# Patient Record
Sex: Female | Born: 1960 | Race: White | Hispanic: No | Marital: Married | Smoking: Former smoker
Health system: Southern US, Community
[De-identification: ages and names within clinical notes are randomized; demographics above are authoritative.]

## PROBLEM LIST (undated history)

## (undated) DIAGNOSIS — C801 Malignant (primary) neoplasm, unspecified: Secondary | ICD-10-CM

## (undated) DIAGNOSIS — K219 Gastro-esophageal reflux disease without esophagitis: Secondary | ICD-10-CM

## (undated) DIAGNOSIS — T7840XA Allergy, unspecified, initial encounter: Secondary | ICD-10-CM

## (undated) DIAGNOSIS — M1990 Unspecified osteoarthritis, unspecified site: Secondary | ICD-10-CM

## (undated) HISTORY — DX: Malignant (primary) neoplasm, unspecified: C80.1

## (undated) HISTORY — DX: Allergy, unspecified, initial encounter: T78.40XA

## (undated) HISTORY — PX: PANCREATICODUODENECTOMY: SUR1000

## (undated) HISTORY — DX: Gastro-esophageal reflux disease without esophagitis: K21.9

## (undated) HISTORY — DX: Unspecified osteoarthritis, unspecified site: M19.90

## (undated) HISTORY — PX: OTHER SURGICAL HISTORY: SHX169

---

## 2001-03-11 ENCOUNTER — Other Ambulatory Visit: Admission: RE | Admit: 2001-03-11 | Discharge: 2001-03-11 | Payer: Self-pay | Admitting: Obstetrics and Gynecology

## 2002-05-20 ENCOUNTER — Other Ambulatory Visit: Admission: RE | Admit: 2002-05-20 | Discharge: 2002-05-20 | Payer: Self-pay | Admitting: Family Medicine

## 2003-06-24 ENCOUNTER — Other Ambulatory Visit: Admission: RE | Admit: 2003-06-24 | Discharge: 2003-06-24 | Payer: Self-pay | Admitting: Family Medicine

## 2003-10-25 ENCOUNTER — Ambulatory Visit (HOSPITAL_COMMUNITY): Admission: RE | Admit: 2003-10-25 | Discharge: 2003-10-25 | Payer: Self-pay | Admitting: Family Medicine

## 2004-06-30 ENCOUNTER — Other Ambulatory Visit: Admission: RE | Admit: 2004-06-30 | Discharge: 2004-06-30 | Payer: Self-pay | Admitting: Family Medicine

## 2004-07-10 ENCOUNTER — Encounter: Admission: RE | Admit: 2004-07-10 | Discharge: 2004-07-10 | Payer: Self-pay | Admitting: Family Medicine

## 2005-07-16 ENCOUNTER — Other Ambulatory Visit: Admission: RE | Admit: 2005-07-16 | Discharge: 2005-07-16 | Payer: Self-pay | Admitting: Family Medicine

## 2005-07-20 ENCOUNTER — Encounter: Admission: RE | Admit: 2005-07-20 | Discharge: 2005-07-20 | Payer: Self-pay | Admitting: Family Medicine

## 2005-10-02 ENCOUNTER — Ambulatory Visit (HOSPITAL_COMMUNITY): Admission: RE | Admit: 2005-10-02 | Discharge: 2005-10-02 | Payer: Self-pay | Admitting: Family Medicine

## 2006-09-25 ENCOUNTER — Other Ambulatory Visit: Admission: RE | Admit: 2006-09-25 | Discharge: 2006-09-25 | Payer: Self-pay | Admitting: Family Medicine

## 2006-10-11 ENCOUNTER — Encounter: Admission: RE | Admit: 2006-10-11 | Discharge: 2006-10-11 | Payer: Self-pay | Admitting: Family Medicine

## 2006-12-26 ENCOUNTER — Ambulatory Visit: Payer: Self-pay | Admitting: Oncology

## 2007-01-23 LAB — CBC WITH DIFFERENTIAL/PLATELET
BASO%: 1.3 % (ref 0.0–2.0)
Basophils Absolute: 0.1 10*3/uL (ref 0.0–0.1)
Eosinophils Absolute: 0.3 10*3/uL (ref 0.0–0.5)
MCH: 30.8 pg (ref 26.0–34.0)
MCV: 86 fL (ref 81.0–101.0)
MONO%: 4.3 % (ref 0.0–13.0)
NEUT%: 63.3 % (ref 39.6–76.8)
RDW: 12.5 % (ref 11.3–14.5)
WBC: 11 10*3/uL — ABNORMAL HIGH (ref 3.9–10.0)

## 2007-01-23 LAB — CHCC SMEAR

## 2007-01-24 LAB — COMPREHENSIVE METABOLIC PANEL
AST: 12 U/L (ref 0–37)
Albumin: 4.3 g/dL (ref 3.5–5.2)
Alkaline Phosphatase: 43 U/L (ref 39–117)
BUN: 18 mg/dL (ref 6–23)
Creatinine, Ser: 1.13 mg/dL (ref 0.40–1.20)
Glucose, Bld: 87 mg/dL (ref 70–99)
Total Bilirubin: 0.5 mg/dL (ref 0.3–1.2)

## 2007-01-24 LAB — SEDIMENTATION RATE: Sed Rate: 11 mm/hr (ref 0–22)

## 2007-06-10 ENCOUNTER — Ambulatory Visit (HOSPITAL_COMMUNITY): Admission: RE | Admit: 2007-06-10 | Discharge: 2007-06-10 | Payer: Self-pay | Admitting: Family Medicine

## 2007-06-16 ENCOUNTER — Encounter: Admission: RE | Admit: 2007-06-16 | Discharge: 2007-06-16 | Payer: Self-pay | Admitting: Family Medicine

## 2007-09-29 ENCOUNTER — Other Ambulatory Visit: Admission: RE | Admit: 2007-09-29 | Discharge: 2007-09-29 | Payer: Self-pay | Admitting: Family Medicine

## 2007-10-20 ENCOUNTER — Encounter: Admission: RE | Admit: 2007-10-20 | Discharge: 2007-10-20 | Payer: Self-pay | Admitting: Family Medicine

## 2007-11-14 ENCOUNTER — Encounter: Payer: Self-pay | Admitting: Internal Medicine

## 2007-11-17 ENCOUNTER — Encounter: Payer: Self-pay | Admitting: Internal Medicine

## 2007-11-24 ENCOUNTER — Encounter: Payer: Self-pay | Admitting: Internal Medicine

## 2007-11-25 ENCOUNTER — Encounter: Admission: RE | Admit: 2007-11-25 | Discharge: 2007-11-25 | Payer: Self-pay | Admitting: Family Medicine

## 2007-11-25 ENCOUNTER — Encounter: Payer: Self-pay | Admitting: Internal Medicine

## 2007-12-09 ENCOUNTER — Ambulatory Visit: Payer: Self-pay | Admitting: Internal Medicine

## 2007-12-09 DIAGNOSIS — R0609 Other forms of dyspnea: Secondary | ICD-10-CM | POA: Insufficient documentation

## 2007-12-09 DIAGNOSIS — R0989 Other specified symptoms and signs involving the circulatory and respiratory systems: Secondary | ICD-10-CM | POA: Insufficient documentation

## 2007-12-09 DIAGNOSIS — Z85828 Personal history of other malignant neoplasm of skin: Secondary | ICD-10-CM | POA: Insufficient documentation

## 2007-12-09 DIAGNOSIS — E039 Hypothyroidism, unspecified: Secondary | ICD-10-CM | POA: Insufficient documentation

## 2007-12-09 DIAGNOSIS — K219 Gastro-esophageal reflux disease without esophagitis: Secondary | ICD-10-CM | POA: Insufficient documentation

## 2007-12-09 DIAGNOSIS — M1990 Unspecified osteoarthritis, unspecified site: Secondary | ICD-10-CM | POA: Insufficient documentation

## 2007-12-12 LAB — CONVERTED CEMR LAB: H Pylori IgG: NEGATIVE

## 2007-12-19 ENCOUNTER — Telehealth: Payer: Self-pay | Admitting: Internal Medicine

## 2007-12-23 ENCOUNTER — Ambulatory Visit: Payer: Self-pay | Admitting: Internal Medicine

## 2007-12-23 DIAGNOSIS — J4540 Moderate persistent asthma, uncomplicated: Secondary | ICD-10-CM | POA: Insufficient documentation

## 2007-12-23 DIAGNOSIS — J309 Allergic rhinitis, unspecified: Secondary | ICD-10-CM | POA: Insufficient documentation

## 2008-01-12 ENCOUNTER — Telehealth: Payer: Self-pay | Admitting: Internal Medicine

## 2008-01-20 ENCOUNTER — Telehealth (INDEPENDENT_AMBULATORY_CARE_PROVIDER_SITE_OTHER): Payer: Self-pay | Admitting: *Deleted

## 2008-02-04 ENCOUNTER — Ambulatory Visit: Payer: Self-pay | Admitting: Critical Care Medicine

## 2008-02-04 ENCOUNTER — Ambulatory Visit: Payer: Self-pay | Admitting: Internal Medicine

## 2008-02-05 ENCOUNTER — Telehealth: Payer: Self-pay | Admitting: Critical Care Medicine

## 2008-02-05 ENCOUNTER — Encounter: Payer: Self-pay | Admitting: Critical Care Medicine

## 2008-02-13 ENCOUNTER — Ambulatory Visit: Payer: Self-pay | Admitting: Internal Medicine

## 2008-02-13 LAB — CONVERTED CEMR LAB: IgE (Immunoglobulin E), Serum: 32.6 intl units/mL (ref 0.0–180.0)

## 2008-02-16 ENCOUNTER — Ambulatory Visit: Payer: Self-pay | Admitting: Critical Care Medicine

## 2008-02-17 ENCOUNTER — Encounter: Payer: Self-pay | Admitting: Critical Care Medicine

## 2008-02-24 ENCOUNTER — Telehealth: Payer: Self-pay | Admitting: Internal Medicine

## 2008-03-15 ENCOUNTER — Ambulatory Visit: Payer: Self-pay | Admitting: Critical Care Medicine

## 2008-04-09 ENCOUNTER — Ambulatory Visit: Payer: Self-pay | Admitting: Internal Medicine

## 2008-04-09 LAB — CONVERTED CEMR LAB: T3, Free: 3.1 pg/mL (ref 2.3–4.2)

## 2008-06-09 ENCOUNTER — Ambulatory Visit: Payer: Self-pay | Admitting: Critical Care Medicine

## 2008-06-09 DIAGNOSIS — J209 Acute bronchitis, unspecified: Secondary | ICD-10-CM | POA: Insufficient documentation

## 2008-06-10 ENCOUNTER — Encounter: Payer: Self-pay | Admitting: Internal Medicine

## 2008-06-10 ENCOUNTER — Telehealth: Payer: Self-pay | Admitting: Internal Medicine

## 2008-07-02 ENCOUNTER — Ambulatory Visit: Payer: Self-pay | Admitting: Critical Care Medicine

## 2008-09-15 ENCOUNTER — Ambulatory Visit: Payer: Self-pay | Admitting: Critical Care Medicine

## 2008-09-30 ENCOUNTER — Other Ambulatory Visit: Admission: RE | Admit: 2008-09-30 | Discharge: 2008-09-30 | Payer: Self-pay | Admitting: Family Medicine

## 2008-10-13 ENCOUNTER — Encounter: Admission: RE | Admit: 2008-10-13 | Discharge: 2008-10-13 | Payer: Self-pay | Admitting: Family Medicine

## 2008-11-15 ENCOUNTER — Ambulatory Visit: Payer: Self-pay | Admitting: Critical Care Medicine

## 2008-11-16 ENCOUNTER — Telehealth (INDEPENDENT_AMBULATORY_CARE_PROVIDER_SITE_OTHER): Payer: Self-pay | Admitting: *Deleted

## 2009-04-01 ENCOUNTER — Ambulatory Visit: Payer: Self-pay | Admitting: Critical Care Medicine

## 2009-05-06 ENCOUNTER — Ambulatory Visit: Payer: Self-pay | Admitting: Critical Care Medicine

## 2009-07-10 IMAGING — CT CT ANGIO CHEST
2 of 5 series · 19 of 36 positions shown · IV contrast ([ID] OMNI 300)
Comparison: Chest radiographs, [DATE].

CLINICAL DATA: Chest pain.  Short of breath.  Cough.   Assess for pulmonary emboli. 
 CHEST CT ANGIO WITH CONTRAST (PE STUDIES):
TECHNIQUE: Multidetector CT imaging of the chest was performed during bolus injection of intravenous contrast.  Multiplanar CT angiographic image reconstructions were generated to evaluate the vascular anatomy.
 Contrast: 125 cc Omnipaque 300

[Series 6: thin recons · axial · 0.70mm/px · z∈[-217,+26]mm · 16 of 221 slices shown]
[im 13/221  lung]
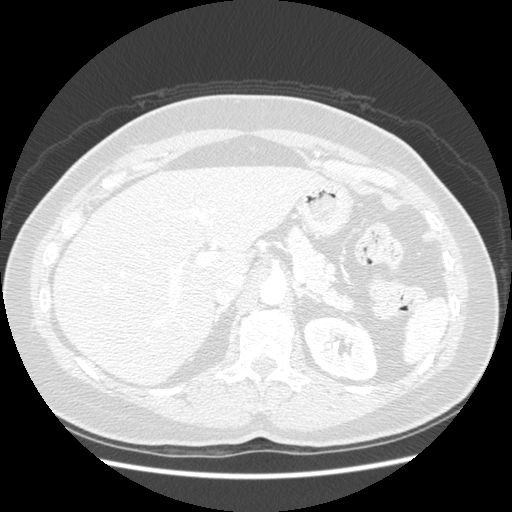
[im 26/221  mediastinal]
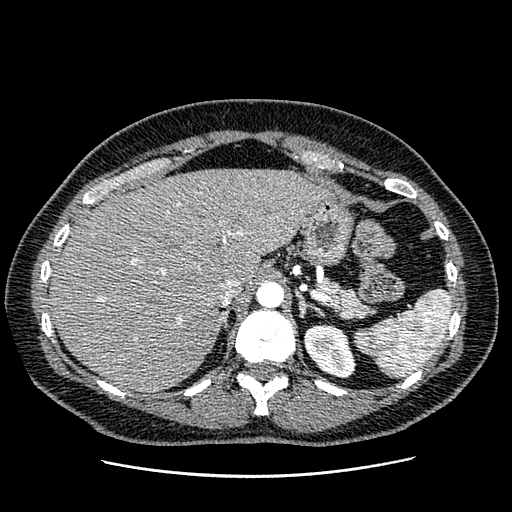
[im 39/221  lung]
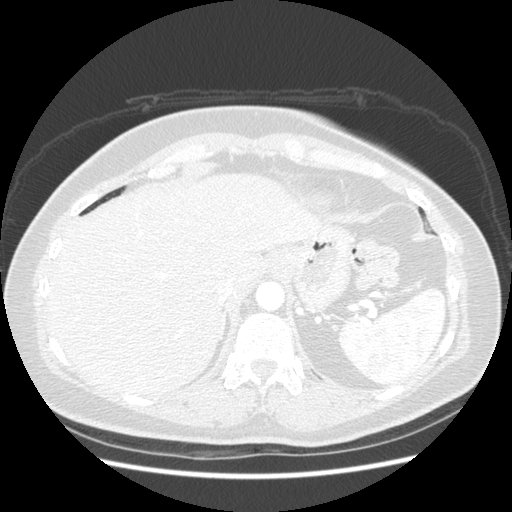
[im 52/221  mediastinal]
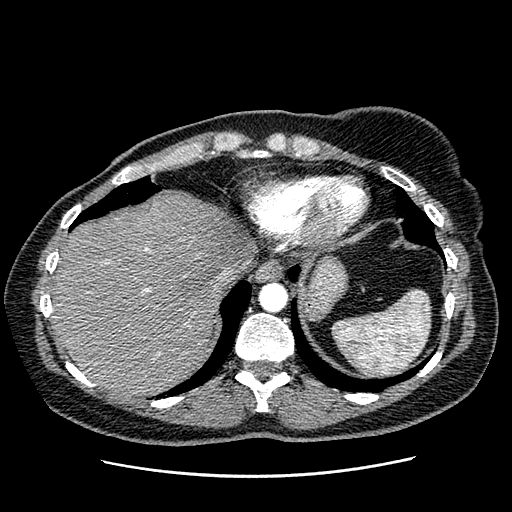
[im 65/221  lung]
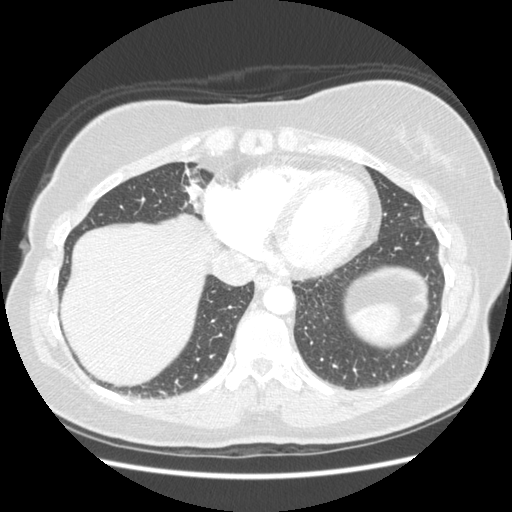
[im 78/221  mediastinal]
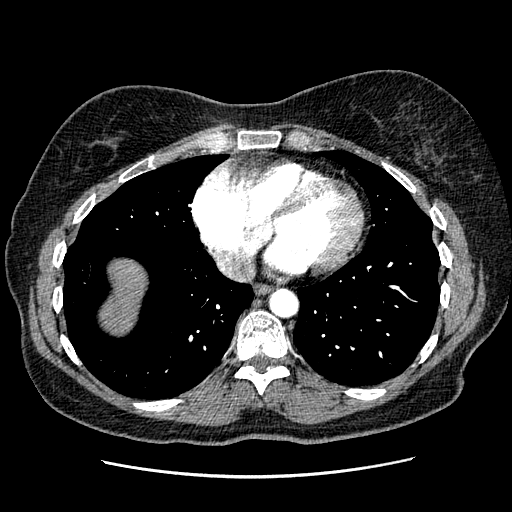
[im 91/221  lung]
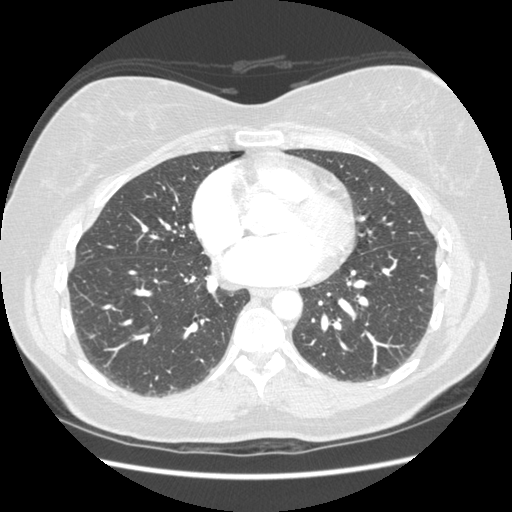
[im 104/221  mediastinal]
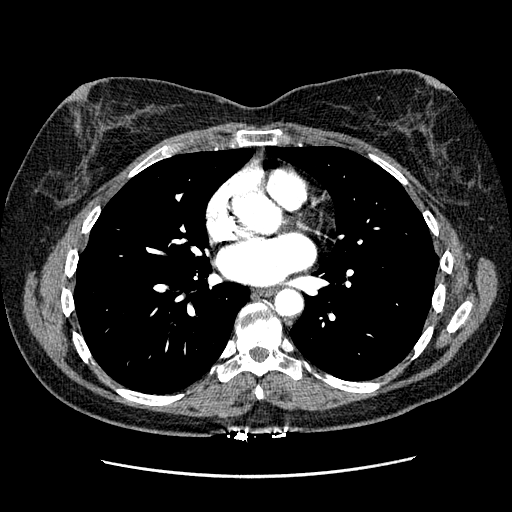
[im 117/221  lung]
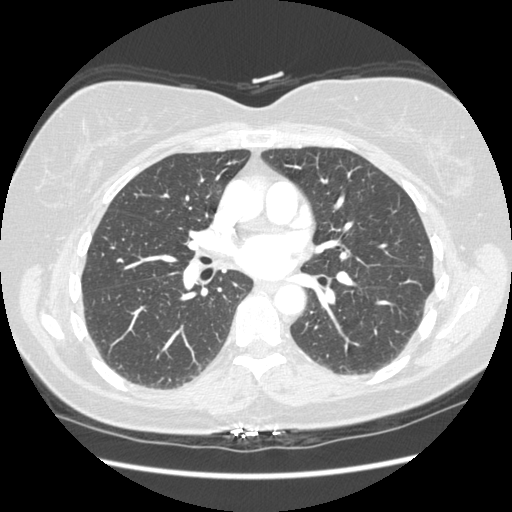
[im 130/221  mediastinal]
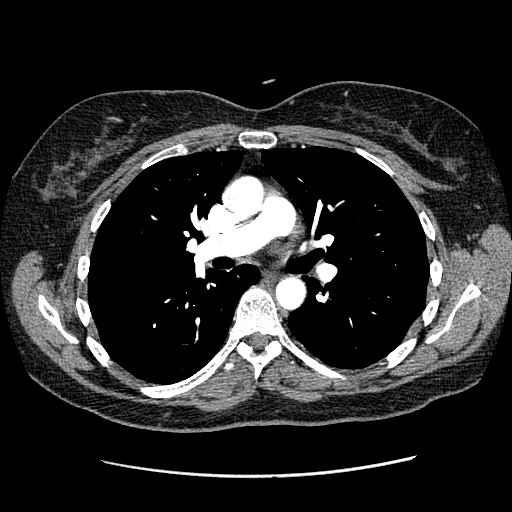
[im 143/221  lung]
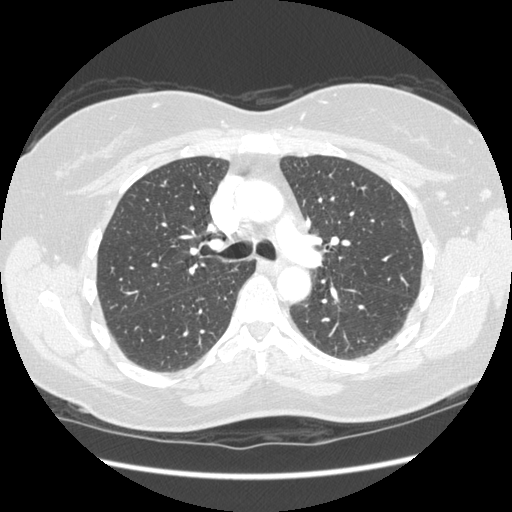
[im 156/221  mediastinal]
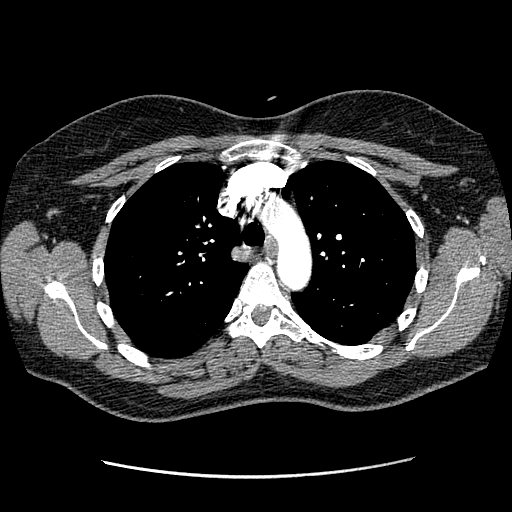
[im 169/221  lung]
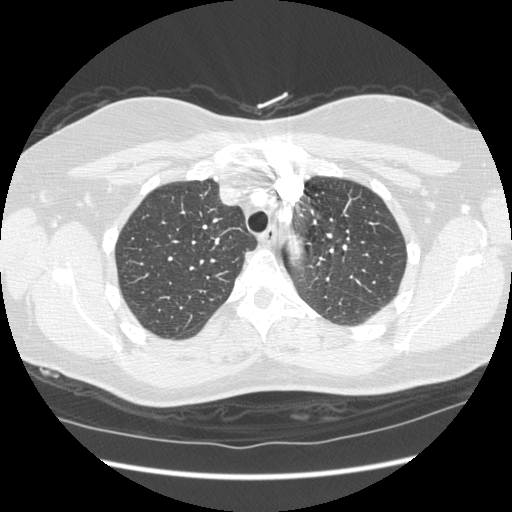
[im 182/221  mediastinal]
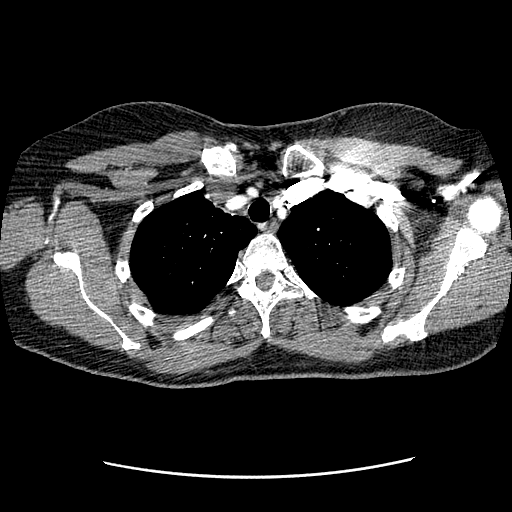
[im 195/221  lung]
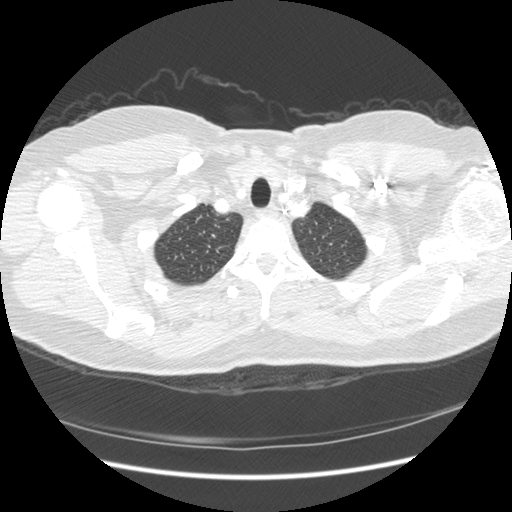
[im 208/221  mediastinal]
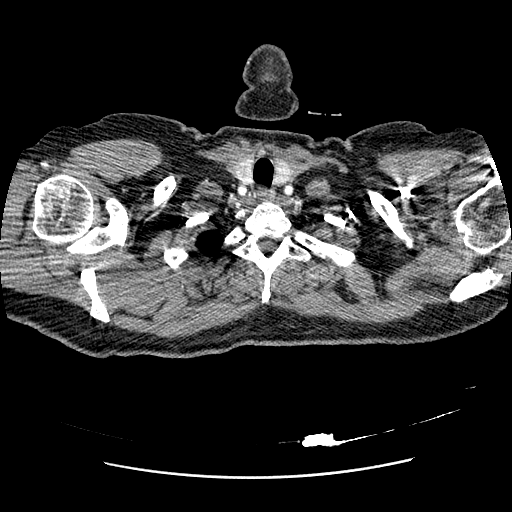

[Series 601: coronal · coronal · 0.70mm/px · 3 of 107 slices shown]
[im 22/107  mediastinal]
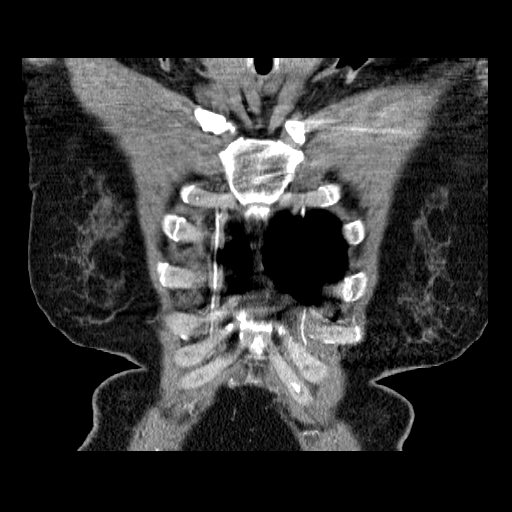
[im 43/107  mediastinal]
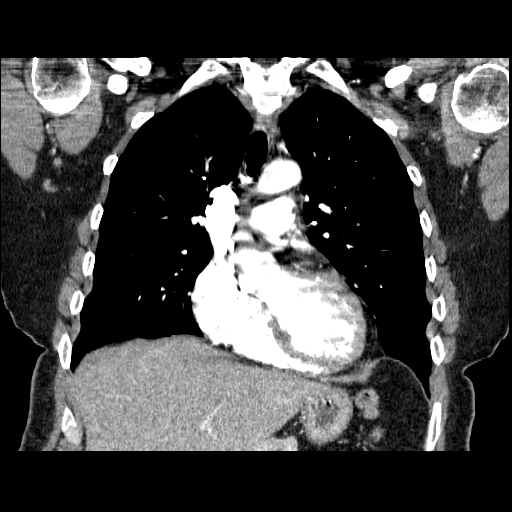
[im 64/107  mediastinal]
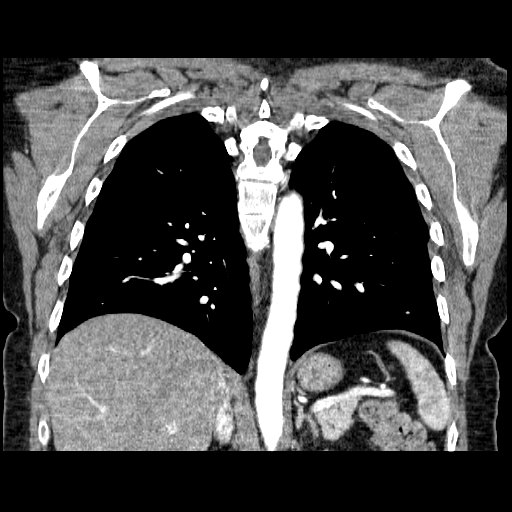

[19 of 36 positions shown; findings below may reference images not displayed]

FINDINGS: No pleural or pericardial fluid.  No mediastinal or hilar mass or adenopathy.  Pulmonary artery branches are well-opacified.  No pulmonary emboli are present.  The aorta is unremarkable.  Scans in the upper abdomen show a few renal calculi on the left, not apparently obstructive.  The spine appears unremarkable.
IMPRESSION: 1.  Negative for pulmonary emboli or any significant or active chest disease.   No cause of chest pain or cough is identified.
 2.  Left renal calculi suspected.

## 2009-09-02 ENCOUNTER — Ambulatory Visit: Payer: Self-pay | Admitting: Critical Care Medicine

## 2009-09-02 DIAGNOSIS — J018 Other acute sinusitis: Secondary | ICD-10-CM | POA: Insufficient documentation

## 2009-09-19 ENCOUNTER — Ambulatory Visit: Payer: Self-pay | Admitting: Critical Care Medicine

## 2009-10-17 ENCOUNTER — Other Ambulatory Visit: Admission: RE | Admit: 2009-10-17 | Discharge: 2009-10-17 | Payer: Self-pay | Admitting: Family Medicine

## 2010-01-05 ENCOUNTER — Encounter: Admission: RE | Admit: 2010-01-05 | Discharge: 2010-01-05 | Payer: Self-pay | Admitting: Family Medicine

## 2010-06-12 ENCOUNTER — Telehealth: Payer: Self-pay | Admitting: Critical Care Medicine

## 2010-06-16 ENCOUNTER — Ambulatory Visit: Payer: Self-pay | Admitting: Critical Care Medicine

## 2010-10-19 ENCOUNTER — Other Ambulatory Visit
Admission: RE | Admit: 2010-10-19 | Discharge: 2010-10-19 | Payer: Self-pay | Source: Home / Self Care | Admitting: Family Medicine

## 2010-10-19 ENCOUNTER — Other Ambulatory Visit: Payer: Self-pay | Admitting: Family Medicine

## 2010-10-23 ENCOUNTER — Encounter: Payer: Self-pay | Admitting: Family Medicine

## 2010-10-31 NOTE — Progress Notes (Signed)
Summary: change ins Qvar is too expensive  Phone Note Call from Patient Call back at Home Phone [phone]   Caller: Patient Call For: wright Reason for Call: Talk to Nurse Summary of Call: sample of Qvar- $100 + Initial call taken by: Eugene Gavia,  June 12, 2010 10:36 AM  Follow-up for Phone Call        Called and spoke with pt who advised she has changed insurances and the new cost for Qvar is going to be $107 and is requesting samples to pick up at Friday's appointment. Currently we are out of Qvar samples and feels the Qvar helps with her breathing. Called and left message on v-mail advising pt we currently do not have any samples but will be on the look out for some to hold for her. Will forward to PW for review. Zackery Barefoot CMA  June 12, 2010 11:06 AM   Additional Follow-up for Phone Call Additional follow up Details #1::        i am ok to give samples if we have available Additional Follow-up by: Storm Frisk MD,  June 12, 2010 12:12 PM    Additional Follow-up for Phone Call Additional follow up Details #2::    No samples available at this time will send flag to Gweneth Dimitri. Zackery Barefoot CMA  June 12, 2010 12:15 PM

## 2010-10-31 NOTE — Assessment & Plan Note (Signed)
Summary: Pulmonary OV   Copy to:  Ritta Slot, donna gates Primary Provider/Referring Provider:  Dr. Darryll Capers  CC:  Follow up.  Last seen 12.2010.  Pt states as long as she is taking qvar breathing is "99% good."  Occ dry "irritating" cough. .  History of Present Illness: 27  WF  with known history of chronic cough.  Chronic sinusitis/ moderate persistent asthma/chronic rhinits.     June 16, 2010 9:07 AM Has been doing well all year.  No new issues.  Using qvar 1-2 puff twice  daily.  Not using rescue inhaler.  Pt denies any significant sore throat, nasal congestion or excess secretions, fever, chills, sweats, unintended weight loss, pleurtic or exertional chest pain, orthopnea PND, or leg swelling Pt denies any increase in rescue therapy over baseline, denies waking up needing it or having any early am or nocturnal exacerbations of coughing/wheezing/or dyspnea.   Asthma History    Asthma Control Assessment:    Age range: 12+ years    Symptoms: 0-2 days/week    Nighttime Awakenings: 0-2/month    Interferes w/ normal activity: no limitations    SABA use (not for EIB): 0-2 days/week    ATAQ questionnaire: 0    FEV1: 2.86 liters (today)    FEV1 Pred: 2.61 liters (today)    Exacerbations requiring oral systemic steroids: 0-1/year    Asthma Control Assessment: Well Controlled   Preventive Screening-Counseling & Management  Alcohol-Tobacco     Smoking Status: never     Year Quit: 1987     Pack years: 1 ppd x 3-4 years     Passive Smoke Exposure: yes  Current Medications (verified): 1)  Synthroid 50 Mcg  Tabs (Levothyroxine Sodium) .... Once Daily 2)  Tri-Sprintec 0.035 Mg  Tabs (Norgestimate-Ethinyl Estradiol) .... As Directed 3)  Nexium 40 Mg  Cpdr (Esomeprazole Magnesium) .Marland Kitchen.. 1 By Mouth Once Daily 4)  Saline Nasal Spray 0.65 % Soln (Saline) .... One To Two Times Daily 5)  Lloyd Huger Med Nasal Wash .... As Needed 6)  Qvar 40 Mcg/act Aers (Beclomethasone Dipropionate) ....  Inhale 2 Puffs One To Two Times Daily  Allergies (verified): 1)  ! Tetanus-Diphtheria Toxoids Td (Tetanus-Diphtheria Toxoids Td) 2)  ! Anacin (Aspirin) 3)  ! Augmentin  Past History:  Past medical, surgical, family and social histories (including risk factors) reviewed, and no changes noted (except as noted below).  Past Medical History: Reviewed history from 06/09/2008 and no changes required. Hypothyroidism Skin cancer, hx of Osteoarthritis GERD Allergic rhinitis Asthma  Past Surgical History: Reviewed history from 12/09/2007 and no changes required. removal of melanoma on rt knee scar after arthroscopy arthroscopic of rt knee sterno-clavicular surg after dislocation detached retina surgery  Family History: Reviewed history from 12/09/2007 and no changes required. Father: melanoma/HTN Mother: deceased/duodenem ca Siblings: 1 brother and 1 sister-- brother has heart disease and no know health problem with sister  Social History: Reviewed history from 11/15/2008 and no changes required. Married  Alcohol use-no Drug use-no Patient states former smoker.   Quit in 1986.  1-2ppd x 4 yrs. From [place], moved to [place] 2003  Review of Systems  The patient denies shortness of breath with activity, shortness of breath at rest, productive cough, non-productive cough, coughing up blood, chest pain, irregular heartbeats, acid heartburn, indigestion, loss of appetite, weight change, abdominal pain, difficulty swallowing, sore throat, tooth/dental problems, headaches, nasal congestion/difficulty breathing through nose, sneezing, itching, ear ache, anxiety, depression, hand/feet swelling, joint stiffness or pain, rash,  change in color of mucus, and fever.    Vital Signs:  Patient profile:   50 year old female Height:      64 inches Weight:      172.50 pounds BMI:     29.72 O2 Sat:      99 % on Room air Temp:     97.8 degrees F oral Pulse rate:   65 / minute BP sitting:   140  / 80  (left arm) Cuff size:   regular  Vitals Entered By: Gweneth Dimitri RN (June 16, 2010 9:00 AM)  O2 Flow:  Room air CC: Follow up.  Last seen 12.2010.  Pt states as long as she is taking qvar breathing is "99% good."  Occ dry "irritating" cough.  Comments Medications reviewed with patient Daytime contact number verified with patient. Crystal Jones RN  June 16, 2010 9:00 AM    Physical Exam  Additional Exam:  Gen: WD WN     WF     in NAD    NCAT Heent:  no jvd, no TMG, no cervical LNademopathy, orophyx clear, nares clear Cor: RRR nl s1/s2  no s3/s4  no m r h g Abd: soft NT BSA   no masses  No HSM  no rebound or guarding Ext perfused with no c v e v.d Neuro: intact, moves all 4s, CN II-XII intact, DTRs intact Chest:clear   Pre-Spirometry FEV1    Value: 2.86 L     Pred: 2.61 L     Impression & Recommendations:  Problem # 1:  ASTHMA, INTRINSIC (ICD-493.10) Assessment Improved  Moderate persistent asthma stable  plan cont  qvar twice daily as needed saba   Medications Added to Medication List This Visit: 1)  Saline Nasal Spray 0.65 % Soln (Saline) .... One to two times daily 2)  Lloyd Huger Med [employer]  .... As needed 3)  Qvar 40 Mcg/act Aers (Beclomethasone dipropionate) .... Inhale 2 puffs one to two times daily 4)  Proair Hfa 108 (90 Base) Mcg/act Aers (Albuterol sulfate) .Marland Kitchen.. 1-2 puffs every 4-6 hours as needed  Complete Medication List: 1)  Synthroid 50 Mcg Tabs (Levothyroxine sodium) .... Once daily 2)  Tri-sprintec 0.035 Mg Tabs (Norgestimate-ethinyl estradiol) .... As directed 3)  Nexium 40 Mg Cpdr (Esomeprazole magnesium) .Marland Kitchen.. 1 by mouth once daily 4)  Saline Nasal Spray 0.65 % Soln (Saline) .... One to two times daily 5)  Lloyd Huger Med [employer]  .... As needed 6)  Qvar 40 Mcg/act Aers (Beclomethasone dipropionate) .... Inhale 2 puffs one to two times daily 7)  Proair Hfa 108 (90 Base) Mcg/act Aers (Albuterol sulfate) .Marland Kitchen.. 1-2 puffs every 4-6 hours as  needed  Other Orders: Est. Patient Level II (01027)  Patient Instructions: 1)  Proair or proventil as needed  2)  Qvar two puffs twice daily 3)  Return 12 months or as needed Prescriptions: PROAIR HFA 108 (90 BASE) MCG/ACT  AERS (ALBUTEROL SULFATE) 1-2 puffs every 4-6 hours as needed  #1 x 6   Entered and Authorized by:   Storm Frisk MD   Signed by:   Storm Frisk MD on 06/16/2010   Method used:   Electronically to        [id number]* (retail)       [address]       [place], [place]  [address]       Ph: [phone]       Fax: [phone]  RxID:   [id number] QVAR 40 MCG/ACT AERS (BECLOMETHASONE DIPROPIONATE) Inhale 2 puffs one to two times daily  #1 x 6   Entered and Authorized by:   Storm Frisk MD   Signed by:   Storm Frisk MD on 06/16/2010   Method used:   Print then Give to Patient   RxID:   [id number]    Immunization History:  Influenza Immunization History:    Influenza:  historical (08/01/2009)

## 2010-12-27 ENCOUNTER — Other Ambulatory Visit: Payer: Self-pay | Admitting: Family Medicine

## 2011-01-03 ENCOUNTER — Telehealth: Payer: Self-pay | Admitting: Critical Care Medicine

## 2011-01-03 NOTE — Telephone Encounter (Signed)
Called and advised pt that we have no samples of QVAR currently. I asked pt if she needed an rx sent to pharmacy and she states she did not.

## 2011-01-09 ENCOUNTER — Other Ambulatory Visit: Payer: Self-pay | Admitting: Family Medicine

## 2011-01-09 DIAGNOSIS — N644 Mastodynia: Secondary | ICD-10-CM

## 2011-01-10 ENCOUNTER — Ambulatory Visit
Admission: RE | Admit: 2011-01-10 | Discharge: 2011-01-10 | Disposition: A | Discharge status: Home / Self Care | Payer: BLUE CROSS/BLUE SHIELD | Source: Ambulatory Visit | Attending: Family Medicine | Admitting: Family Medicine

## 2011-01-10 ENCOUNTER — Other Ambulatory Visit: Payer: Self-pay | Admitting: Family Medicine

## 2011-01-10 DIAGNOSIS — N644 Mastodynia: Secondary | ICD-10-CM

## 2011-02-16 NOTE — Group Therapy Note (Signed)
NAMEARIELLA, VOIT NO.:  [id number]   MEDICAL RECORD NO.:  [id number]                   PATIENT TYPE:  OUT   LOCATION:  [facility]                                 FACILITY:  [facility]   PHYSICIAN:  Vania Rea, M.D.              DATE OF BIRTH:  08/15/1961   DATE OF PROCEDURE:  DATE OF DISCHARGE:  10/25/2003                                   PROGRESS NOTE   No dictation for this job.      ___________________________________________                                            Vania Rea, M.D.   LC/MEDQ  D:  01/29/2004  T:  01/29/2004  Job:  [id number]

## 2011-04-05 ENCOUNTER — Encounter: Payer: Self-pay | Admitting: Internal Medicine

## 2011-04-06 ENCOUNTER — Ambulatory Visit: Payer: BLUE CROSS/BLUE SHIELD | Admitting: Internal Medicine

## 2011-04-06 ENCOUNTER — Encounter: Payer: Self-pay | Admitting: Pulmonary Disease

## 2011-04-06 ENCOUNTER — Telehealth: Payer: Self-pay | Admitting: Critical Care Medicine

## 2011-04-06 ENCOUNTER — Ambulatory Visit (INDEPENDENT_AMBULATORY_CARE_PROVIDER_SITE_OTHER): Payer: BC Managed Care – PPO | Admitting: Pulmonary Disease

## 2011-04-06 VITALS — BP 128/80 | HR 66 | Temp 98.2°F | Ht 64.0 in | Wt 171.4 lb

## 2011-04-06 DIAGNOSIS — J45901 Unspecified asthma with (acute) exacerbation: Secondary | ICD-10-CM

## 2011-04-06 MED ORDER — LEVOFLOXACIN 750 MG PO TABS: 750.0000 mg | ORAL_TABLET | Freq: Every day | ORAL | Status: AC

## 2011-04-06 MED ORDER — ALBUTEROL SULFATE HFA 108 (90 BASE) MCG/ACT IN AERS: 2.0000 | INHALATION_SPRAY | Freq: Four times a day (QID) | RESPIRATORY_TRACT | Status: DC | PRN

## 2011-04-06 MED ORDER — BENZONATATE 100 MG PO CAPS: 200.0000 mg | ORAL_CAPSULE | Freq: Four times a day (QID) | ORAL | Status: AC | PRN

## 2011-04-06 MED ORDER — BECLOMETHASONE DIPROPIONATE 40 MCG/ACT IN AERS: 2.0000 | INHALATION_SPRAY | Freq: Two times a day (BID) | RESPIRATORY_TRACT | Status: DC

## 2011-04-06 MED ORDER — PREDNISONE 10 MG PO TABS: ORAL_TABLET | ORAL | Status: DC

## 2011-04-06 NOTE — Telephone Encounter (Signed)
Spoke with pt.  She c/o SOB and nonprod cough since yesterday.  Pt last OV with PW on 06/16/10.  OV scheduled for today at 1:30pm with [facility] -- pt aware and ok with this appt.

## 2011-04-06 NOTE — Progress Notes (Signed)
  Subjective:    Patient ID: Lynn Ashley, female    DOB: 1961/08/06, 50 y.o.   MRN: [id number]  HPI The pt comes in today for an acute sick visit.  She has known asthma and rhinosinusitis, and is usually followed by Dr. Delford Field.  She gives a few days history of increased cough, congestion, sob, but is not bringing up mucus.  She has required increased use of her rescue inhaler.  She states this is "just the beginning", and usually she gets a bad "chest infection"   Review of Systems  Constitutional: Negative for fever and unexpected weight change.  HENT: Positive for congestion and sneezing. Negative for ear pain, nosebleeds, sore throat, rhinorrhea, trouble swallowing, dental problem, postnasal drip and sinus pressure.   Eyes: Negative for redness and itching.  Respiratory: Positive for cough and shortness of breath. Negative for chest tightness and wheezing.   Cardiovascular: Positive for chest pain. Negative for palpitations and leg swelling.  Gastrointestinal: Negative for nausea and vomiting.  Genitourinary: Negative for dysuria.  Musculoskeletal: Negative for joint swelling.  Skin: Negative for rash.  Neurological: Positive for headaches.  Hematological: Does not bruise/bleed easily.  Psychiatric/Behavioral: Negative for dysphoric mood. The patient is not nervous/anxious.        Objective:   Physical Exam Wd female in nad Nares with inflammed mucosa, but no purulence Op without exudates or lesions seen Chest with rhonchi throughout, good airflow Cor with rrr LE without edema, no cyanosis Alert, oriented, moves all 4        Assessment & Plan:

## 2011-04-06 NOTE — Assessment & Plan Note (Signed)
The pt is having an acute asthma exacerbation, and will require a course of prednisone.  It is unclear if she has a developing sinobronchitis, but will give her a prescription for levaquin to hold, especially since she is going out of town in a few days.

## 2011-04-06 NOTE — Patient Instructions (Signed)
Will treat with a course of prednisone to help with your asthma flareup Will give you a prescription for an antibiotic to hold.  Do not take unless you begin to cough up purulent mucus. Tessalon pearls as directed for cough.

## 2011-07-18 ENCOUNTER — Other Ambulatory Visit: Payer: Self-pay | Admitting: Pulmonary Disease

## 2011-08-14 ENCOUNTER — Ambulatory Visit: Payer: BC Managed Care – PPO | Admitting: Adult Health

## 2011-09-03 ENCOUNTER — Encounter: Payer: Self-pay | Admitting: Critical Care Medicine

## 2011-09-03 ENCOUNTER — Ambulatory Visit (INDEPENDENT_AMBULATORY_CARE_PROVIDER_SITE_OTHER): Payer: 59 | Admitting: Critical Care Medicine

## 2011-09-03 DIAGNOSIS — J45909 Unspecified asthma, uncomplicated: Secondary | ICD-10-CM

## 2011-09-03 MED ORDER — BECLOMETHASONE DIPROPIONATE 40 MCG/ACT IN AERS: 2.0000 | INHALATION_SPRAY | Freq: Two times a day (BID) | RESPIRATORY_TRACT | Status: DC

## 2011-09-03 NOTE — Progress Notes (Signed)
Subjective:    Patient ID: Lynn Ashley, female    DOB: [birth year], 50 y.o.   MRN: [id number]  HPI  35 WF with known history of chronic cough. Chronic sinusitis/ moderate persistent asthma/chronic rhinits.   04/06/11 Acute [facility] OV The pt comes in today for an acute sick visit.  She has known asthma and rhinosinusitis, and is usually followed by Dr. Delford Field.  She gives a few days history of increased cough, congestion, sob, but is not bringing up mucus.  She has required increased use of her rescue inhaler.  She states this is "just the beginning", and usually she gets a bad "chest infection"  12/3 Pt not seen since 06/2010.  Did have an acute OV 7/12..  Was rx pred,  Did not fill levaquin.  Since 7/12,  Feels ok now,   Pt feels tired.  Working 70hrs per week. Notes a scratchy throat.  Occ chokes.  No real pn drip .  Has some nasal congestion and sneezes and will itch. No real cough.  Not using rescue inhaler PUL ASTHMA HISTORY 09/03/2011  Symptoms 0-2 days/week  Nighttime awakenings 0-2/month  Interference with activity No limitations  SABA use 0-2 days/wk  Exacerbations requiring oral steroids 0-1 / year     Review of Systems  Constitutional: Negative for fever and unexpected weight change.  HENT: Positive for congestion and sneezing. Negative for ear pain, nosebleeds, sore throat, rhinorrhea, trouble swallowing, dental problem, postnasal drip and sinus pressure.   Eyes: Negative for redness and itching.  Respiratory: Positive for cough and shortness of breath. Negative for chest tightness and wheezing.   Cardiovascular: Positive for chest pain. Negative for palpitations and leg swelling.  Gastrointestinal: Negative for nausea and vomiting.  Genitourinary: Negative for dysuria.  Musculoskeletal: Negative for joint swelling.  Skin: Negative for rash.  Neurological: Positive for headaches.  Hematological: Does not bruise/bleed easily.  Psychiatric/Behavioral: Negative for  dysphoric mood. The patient is not nervous/anxious.        Objective:   Physical Exam  Filed Vitals:   09/03/11 1350  BP: 122/86  Pulse: 67  Temp: 98.8 F (37.1 C)  TempSrc: Oral  Height: 5\' 4"  (1.626 m)  Weight: 174 lb 6.4 oz (79.107 kg)  SpO2: 98%    Gen: Pleasant, well-nourished, in no distress,  normal affect  ENT: No lesions,  mouth clear,  oropharynx clear, no postnasal drip  Neck: No JVD, no TMG, no carotid bruits  Lungs: No use of accessory muscles, no dullness to percussion, clear without rales or rhonchi  Cardiovascular: RRR, heart sounds normal, no murmur or gallops, no peripheral edema  Abdomen: soft and NT, no HSM,  BS normal  Musculoskeletal: No deformities, no cyanosis or clubbing  Neuro: alert, non focal  Skin: Warm, no lesions or rashes  No results found.   Assessment & Plan:   ASTHMA, INTRINSIC Moderate persistent asthma with allergic rhinitis flare and atopy Plan Cont ICS Use antihistamine     Updated Medication List Outpatient Encounter Prescriptions as of 09/03/2011  Medication Sig Dispense Refill  . albuterol (PROAIR HFA) 108 (90 BASE) MCG/ACT inhaler Inhale 2 puffs into the lungs every 6 (six) hours as needed.  1 Inhaler  0  . beclomethasone (QVAR) 40 MCG/ACT inhaler Inhale 2 puffs into the lungs 2 (two) times daily.  1 Inhaler  6  . esomeprazole (NEXIUM) 40 MG capsule Take 40 mg by mouth daily.        Marland Kitchen levothyroxine (SYNTHROID, LEVOTHROID) 50  MCG tablet Take 50 mcg by mouth daily.        . Multiple Vitamins-Minerals (MULTIVITAMIN GUMMIES ADULT PO) Take 1 tablet by mouth daily.        Marland Kitchen NASAL WASH NA Place into the nose as needed.        Lorita Officer Triphasic (NORGESTIMATE-ETHINYL ESTRADIOL PO) Take by mouth daily.       . sodium chloride (OCEAN) 0.65 % nasal spray Place 1 spray into the nose daily.       Marland Kitchen DISCONTD: QVAR 40 MCG/ACT inhaler INHALE TWO PUFFS BY MOUTH TWICE DAILY  1 Inhaler  0  . DISCONTD: predniSONE  (DELTASONE) 10 MG tablet Take 4 tabs daily x 2 days, then 3 tabs daily x 2 days, then 2 tabs daily x 2 days, then 1 tab daily x 2 days, then stop.  20 tablet  0

## 2011-09-03 NOTE — Patient Instructions (Signed)
Can [place] chlormetron (chlorpheniramine) 4-8mg  at bedtime for sneezing, itching Stay on Qvar 2puff 1-2 times daily Return 12 months , sooner if needed

## 2011-09-04 NOTE — Assessment & Plan Note (Signed)
Moderate persistent asthma with allergic rhinitis flare and atopy Plan Cont ICS Use antihistamine

## 2011-10-24 ENCOUNTER — Ambulatory Visit
Admission: RE | Admit: 2011-10-24 | Discharge: 2011-10-24 | Disposition: A | Discharge status: Home / Self Care | Payer: 59 | Source: Ambulatory Visit | Attending: Family Medicine | Admitting: Family Medicine

## 2011-10-24 ENCOUNTER — Other Ambulatory Visit: Payer: Self-pay | Admitting: Family Medicine

## 2011-10-24 DIAGNOSIS — R1030 Lower abdominal pain, unspecified: Secondary | ICD-10-CM

## 2012-01-18 ENCOUNTER — Other Ambulatory Visit: Payer: Self-pay | Admitting: Family Medicine

## 2012-01-18 DIAGNOSIS — Z1231 Encounter for screening mammogram for malignant neoplasm of breast: Secondary | ICD-10-CM

## 2012-01-27 ENCOUNTER — Other Ambulatory Visit: Payer: Self-pay | Admitting: Pulmonary Disease

## 2012-02-01 ENCOUNTER — Other Ambulatory Visit: Payer: Self-pay | Admitting: *Deleted

## 2012-02-01 DIAGNOSIS — J45901 Unspecified asthma with (acute) exacerbation: Secondary | ICD-10-CM

## 2012-02-01 MED ORDER — ALBUTEROL SULFATE HFA 108 (90 BASE) MCG/ACT IN AERS: 2.0000 | INHALATION_SPRAY | Freq: Four times a day (QID) | RESPIRATORY_TRACT | Status: DC | PRN

## 2012-02-08 ENCOUNTER — Ambulatory Visit
Admission: RE | Admit: 2012-02-08 | Discharge: 2012-02-08 | Disposition: A | Discharge status: Home / Self Care | Payer: 59 | Source: Ambulatory Visit | Attending: Family Medicine | Admitting: Family Medicine

## 2012-02-08 DIAGNOSIS — Z1231 Encounter for screening mammogram for malignant neoplasm of breast: Secondary | ICD-10-CM

## 2012-06-27 ENCOUNTER — Other Ambulatory Visit: Payer: Self-pay | Admitting: Pulmonary Disease

## 2012-07-28 ENCOUNTER — Other Ambulatory Visit: Payer: Self-pay | Admitting: Critical Care Medicine

## 2012-09-03 ENCOUNTER — Ambulatory Visit (INDEPENDENT_AMBULATORY_CARE_PROVIDER_SITE_OTHER): Payer: 59 | Admitting: Critical Care Medicine

## 2012-09-03 ENCOUNTER — Encounter: Payer: Self-pay | Admitting: Critical Care Medicine

## 2012-09-03 VITALS — BP 138/70 | HR 67 | Temp 98.0°F | Ht 64.0 in | Wt 176.5 lb

## 2012-09-03 DIAGNOSIS — Z23 Encounter for immunization: Secondary | ICD-10-CM

## 2012-09-03 DIAGNOSIS — J45901 Unspecified asthma with (acute) exacerbation: Secondary | ICD-10-CM

## 2012-09-03 DIAGNOSIS — J45909 Unspecified asthma, uncomplicated: Secondary | ICD-10-CM

## 2012-09-03 MED ORDER — BECLOMETHASONE DIPROPIONATE 40 MCG/ACT IN AERS: 2.0000 | INHALATION_SPRAY | Freq: Two times a day (BID) | RESPIRATORY_TRACT | Status: DC

## 2012-09-03 MED ORDER — ALBUTEROL SULFATE HFA 108 (90 BASE) MCG/ACT IN AERS: 2.0000 | INHALATION_SPRAY | Freq: Four times a day (QID) | RESPIRATORY_TRACT | Status: DC | PRN

## 2012-09-03 NOTE — Progress Notes (Signed)
Subjective:    Patient ID: Lynn Ashley, female    DOB: Sep 06, 1961, 51 y.o.   MRN: [id number]  HPI 51 y.o. WF  with known history of chronic cough. Chronic sinusitis/ moderate persistent asthma/chronic rhinits.   51/12/2011 Annual f/u: Since last OV walking 1-2 miles per day.  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  Min cough.  Some pndrip and nose blocked in AM.  No SABA use, Maintains Qvar.   PUL ASTHMA HISTORY 09/03/2012 09/03/2011  Symptoms 0-2 days/week 0-2 days/week  Nighttime awakenings 0-2/month 0-2/month  Interference with activity No limitations No limitations  SABA use 0-2 days/wk 0-2 days/wk  Exacerbations requiring oral steroids 0-1 / year 0-1 / year     Review of Systems  Constitutional: Negative for unexpected weight change.  HENT: Positive for congestion. Negative for nosebleeds, dental problem and sinus pressure.   Eyes: Negative for redness and itching.  Respiratory: Negative for chest tightness.   Cardiovascular: Negative for palpitations and leg swelling.  Gastrointestinal: Negative for nausea and vomiting.  Genitourinary: Negative for dysuria.  Musculoskeletal: Negative for joint swelling.  Skin: Negative for rash.  Hematological: Does not bruise/bleed easily.  Psychiatric/Behavioral: Negative for dysphoric mood. The patient is not nervous/anxious.        Objective:   Physical Exam  Filed Vitals:   09/03/12 1353  BP: 138/70  Pulse: 67  Temp: 98 F (36.7 C)  TempSrc: Oral  Height: 5\' 4"  (1.626 m)  Weight: 176 lb 8 oz (80.06 kg)  SpO2: 97%    Gen: Pleasant, well-nourished, in no distress,  normal affect  ENT: No lesions,  mouth clear,  oropharynx clear, no postnasal drip  Neck: No JVD, no TMG, no carotid bruits  Lungs: No use of accessory muscles, no dullness to percussion, clear without rales or rhonchi  Cardiovascular: RRR, heart sounds normal, no murmur or gallops, no  peripheral edema  Abdomen: soft and NT, no HSM,  BS normal  Musculoskeletal: No deformities, no cyanosis or clubbing  Neuro: alert, non focal  Skin: Warm, no lesions or rashes  No results found.   Assessment & Plan:   ASTHMA, INTRINSIC Moderate persistent asthma stable at this time Plan Maintain inhaled medications as prescribed Refills on Qvar and proair sent Flu vaccine given today Return 1 year or as needed      Updated Medication List Outpatient Encounter Prescriptions as of 09/03/2012  Medication Sig Dispense Refill  . albuterol (PROAIR HFA) 108 (90 BASE) MCG/ACT inhaler Inhale 2 puffs into the lungs every 6 (six) hours as needed.  1 Inhaler  5  . beclomethasone (QVAR) 40 MCG/ACT inhaler Inhale 2 puffs into the lungs 2 (two) times daily.  1 Inhaler  6  . esomeprazole (NEXIUM) 40 MG capsule Take 40 mg by mouth every other day.       . levothyroxine (SYNTHROID, LEVOTHROID) 50 MCG tablet Take 50 mcg by mouth daily.        . Multiple Vitamins-Minerals (MULTIVITAMIN GUMMIES ADULT PO) Take 1 tablet by mouth daily.        Marland Kitchen NASAL WASH NA Place into the nose as needed.        Lorita Officer Triphasic (NORGESTIMATE-ETHINYL ESTRADIOL PO) Take by mouth daily.       . sodium chloride (OCEAN) 0.65 % nasal spray Place 1 spray into the nose daily.       . [DISCONTINUED] albuterol (PROAIR HFA) 108 (  90 BASE) MCG/ACT inhaler Inhale 2 puffs into the lungs every 6 (six) hours as needed.  1 Inhaler  5  . [DISCONTINUED] beclomethasone (QVAR) 40 MCG/ACT inhaler Inhale 2 puffs into the lungs 2 (two) times daily.  1 Inhaler  6  . [DISCONTINUED] QVAR 40 MCG/ACT inhaler INHALE TWO PUFFS BY MOUTH TWICE DAILY  1 Inhaler  1

## 2012-09-03 NOTE — Patient Instructions (Addendum)
Refills on Qvar and proair sent Flu vaccine given today Return 1 year or as needed

## 2012-09-04 NOTE — Assessment & Plan Note (Signed)
Moderate persistent asthma stable at this time Plan Maintain inhaled medications as prescribed Refills on Qvar and proair sent Flu vaccine given today Return 1 year or as needed

## 2012-11-04 ENCOUNTER — Telehealth: Payer: Self-pay | Admitting: Critical Care Medicine

## 2012-11-04 MED ORDER — LEVOFLOXACIN 500 MG PO TABS: 500.0000 mg | ORAL_TABLET | Freq: Every day | ORAL | Status: DC

## 2012-11-04 NOTE — Telephone Encounter (Signed)
Per SN----ok to send in levaquin 500 mg  #7  1 daily.  i have called and spoke with the pt and she is aware of medications that have been sent to her pharmacy.

## 2012-11-04 NOTE — Telephone Encounter (Signed)
I spoke with pt. She c/o sore throat, chest congestion, cough w/ green phlem, possible fever last night (felt hot), sweats last  Night, nasal congestion and PND x Saturday. No wheezing, chest tx. She took benzonatate last night and has been using her QVAR inhaler. She is requesting to have something called in. Pt asks if this could be addressed this morning. Will forward to [facility] of the day since PW is off this AM. Please advise Sn thanks [facility] Last OV 09/03/12 No pending APPT Allergies  Allergen Reactions  . Amoxicillin-Pot Clavulanate   . Aspirin-Caffeine     REACTION: childhood - nasuea/vomiting  . Tetanus-Diphtheria Toxoids Td     REACTION: whelps, itchy

## 2012-11-04 NOTE — Telephone Encounter (Signed)
Pt called back & requests this be taken care of asap.  Pt states that she is afraid this will turn into pneumonia if this is not addressed soon.   Antionette Fairy

## 2013-03-19 ENCOUNTER — Other Ambulatory Visit: Payer: Self-pay

## 2013-03-19 DIAGNOSIS — Z1231 Encounter for screening mammogram for malignant neoplasm of breast: Secondary | ICD-10-CM

## 2013-03-27 ENCOUNTER — Ambulatory Visit: Payer: 59

## 2013-04-01 ENCOUNTER — Ambulatory Visit
Admission: RE | Admit: 2013-04-01 | Discharge: 2013-04-01 | Disposition: A | Discharge status: Home / Self Care | Payer: No Typology Code available for payment source | Source: Ambulatory Visit

## 2013-04-01 DIAGNOSIS — Z1231 Encounter for screening mammogram for malignant neoplasm of breast: Secondary | ICD-10-CM

## 2013-07-13 ENCOUNTER — Ambulatory Visit (INDEPENDENT_AMBULATORY_CARE_PROVIDER_SITE_OTHER)
Admission: RE | Admit: 2013-07-13 | Discharge: 2013-07-13 | Disposition: A | Discharge status: Home / Self Care | Payer: No Typology Code available for payment source | Source: Ambulatory Visit | Attending: Adult Health | Admitting: Adult Health

## 2013-07-13 ENCOUNTER — Ambulatory Visit (INDEPENDENT_AMBULATORY_CARE_PROVIDER_SITE_OTHER): Payer: No Typology Code available for payment source | Admitting: Adult Health

## 2013-07-13 ENCOUNTER — Encounter: Payer: Self-pay | Admitting: Adult Health

## 2013-07-13 ENCOUNTER — Encounter (INDEPENDENT_AMBULATORY_CARE_PROVIDER_SITE_OTHER): Payer: Self-pay

## 2013-07-13 VITALS — BP 114/82 | HR 62 | Temp 97.6°F | Ht 64.5 in | Wt 172.4 lb

## 2013-07-13 DIAGNOSIS — J45909 Unspecified asthma, uncomplicated: Secondary | ICD-10-CM

## 2013-07-13 MED ORDER — BENZONATATE 200 MG PO CAPS: 200.0000 mg | ORAL_CAPSULE | Freq: Three times a day (TID) | ORAL | Status: DC | PRN

## 2013-07-13 MED ORDER — PREDNISONE 10 MG PO TABS: ORAL_TABLET | ORAL | Status: DC

## 2013-07-13 NOTE — Assessment & Plan Note (Signed)
Mild flare with URI +/- exposure to triggers/irritant (cleaning products)  CXR neg  Plan  Mucinex DM Twice daily  As needed  Cough/congestion  Claritin 10mg  daily As needed  Drainage  Tessalon Three times a day  As needed  Cough .  Fluids and rest  Push fluids  Saline nasal rinses As needed   Prednisone taper to have on hold if wheezing /cough persists or worsens Please contact office for sooner follow up if symptoms do not improve or worsen or seek emergency care  Follow up Dr. Delford Field  As planned and As needed

## 2013-07-13 NOTE — Progress Notes (Signed)
  Subjective:    Patient ID: Lynn Ashley, female    DOB: 09-02-1961, 52 y.o.   MRN: [id number]  HPI 52 yo WF  with known history of chronic cough. Chronic sinusitis/ moderate persistent asthma/chronic rhinits.    07/13/2013 Acute OV  Complains of dry cough, chest congestion, chest tightness, hoarseness, laryngitis, fatigue, chills onset yesterday evening - reports did some extensive cleaning yesterday.   Not sure if the cleaners aggravated her breathing or she has a cold. Worried she has PNA. Taking care of her dad that has stage 4 lung cancer.  No hemoptysis, chest pain, orthopnea, fever or edema.  CXR today shows no acute process.  Taking QVAR Twice daily with no missed doses.  Using her albuterol inhaler more yesterday.    Review of Systems Constitutional:   No  weight loss, night sweats,  Fevers,  +chills, fatigue, or  lassitude.  HEENT:   No headaches,  Difficulty swallowing,  Tooth/dental problems, or  + Sore throat,                No sneezing, itching, ear ache,  +nasal congestion, post nasal drip,   CV:  No chest pain,  Orthopnea, PND, swelling in lower extremities, anasarca, dizziness, palpitations, syncope.   GI  No heartburn, indigestion, abdominal pain, nausea, vomiting, diarrhea, change in bowel habits, loss of appetite, bloody stools.   Resp: No shortness of breath with exertion or at rest.  No excess mucus, no productive cough,  No non-productive cough,  No coughing up of blood.  No change in color of mucus.  No wheezing.  No chest wall deformity  Skin: no rash or lesions.  GU: no dysuria, change in color of urine, no urgency or frequency.  No flank pain, no hematuria   MS:  No joint pain or swelling.  No decreased range of motion.  No back pain.  Psych:  No change in mood or affect. No depression or anxiety.  No memory loss.         Objective:   Physical Exam GEN: A/Ox3; pleasant , NAD, well nourished   HEENT:  [place]/AT,  EACs-clear, TMs-wnl,  NOSE-clear drainage , THROAT-clear, no lesions, no postnasal drip or exudate noted.   NECK:  Supple w/ fair ROM; no JVD; normal carotid impulses w/o bruits; no thyromegaly or nodules palpated; no lymphadenopathy.  RESP  Clear  P & A; w/o, wheezes/ rales/ or rhonchi.no accessory muscle use, no dullness to percussion  CARD:  RRR, no m/r/g  , no peripheral edema, pulses intact, no cyanosis or clubbing.  GI:   Soft & nt; nml bowel sounds; no organomegaly or masses detected.  Musco: Warm bil, no deformities or joint swelling noted.   Neuro: alert, no focal deficits noted.    Skin: Warm, no lesions or rashes         Assessment & Plan:

## 2013-07-13 NOTE — Patient Instructions (Signed)
Mucinex DM Twice daily  As needed  Cough/congestion  Claritin 10mg  daily As needed  Drainage  Tessalon Three times a day  As needed  Cough .  Fluids and rest  Push fluids  Saline nasal rinses As needed   Prednisone taper to have on hold if wheezing /cough persists or worsens Please contact office for sooner follow up if symptoms do not improve or worsen or seek emergency care  Follow up Dr. Delford Field  As planned and As needed

## 2013-07-15 ENCOUNTER — Telehealth: Payer: Self-pay | Admitting: Critical Care Medicine

## 2013-07-15 NOTE — Telephone Encounter (Signed)
Pt requests results asap.  Pt has another appt this afternoon & would like this info before, please.  Lynn Ashley

## 2013-07-15 NOTE — Telephone Encounter (Signed)
Notes Recorded by Sherre Lain, MA on 07/15/2013 at 12:34 PM LMOM TCB x1. ------  Notes Recorded by Julio Sicks, NP on 07/14/2013 at 5:26 PM cxr is clear  Cont w/ ov recs  Please contact office for sooner follow up if symptoms do not improve or worsen or seek emergency care   Spoke with patient and made aware of results. No questions or concerns.

## 2013-07-15 NOTE — Progress Notes (Signed)
Quick Note:  Pt aware of results;no questions or concerns at this time. ______ 

## 2013-07-15 NOTE — Progress Notes (Signed)
Quick Note:  LMOM TCB x1. ______ 

## 2013-09-01 ENCOUNTER — Telehealth: Payer: Self-pay | Admitting: Critical Care Medicine

## 2013-09-01 NOTE — Telephone Encounter (Signed)
Called pt to schd 1 year follow up with PW. Pt refuse at this time. Will call us back when she feels she needs to see him, per pt.

## 2013-09-26 ENCOUNTER — Other Ambulatory Visit: Payer: Self-pay | Admitting: Critical Care Medicine

## 2013-09-28 ENCOUNTER — Telehealth: Payer: Self-pay | Admitting: Critical Care Medicine

## 2013-09-28 MED ORDER — BECLOMETHASONE DIPROPIONATE 40 MCG/ACT IN AERS: 2.0000 | INHALATION_SPRAY | Freq: Two times a day (BID) | RESPIRATORY_TRACT | Status: DC

## 2013-09-28 NOTE — Telephone Encounter (Signed)
i spoke with pt and aware RX has been sent. Nothing further needed

## 2013-11-11 ENCOUNTER — Telehealth: Payer: Self-pay | Admitting: Critical Care Medicine

## 2013-11-11 NOTE — Telephone Encounter (Signed)
I have printed form from covermymeds and filled out and faxed back for PA on Qvar. Awaiting response on this request [phone]

## 2013-12-07 ENCOUNTER — Other Ambulatory Visit (HOSPITAL_COMMUNITY)
Admission: RE | Admit: 2013-12-07 | Discharge: 2013-12-07 | Disposition: A | Discharge status: Home / Self Care | Payer: BC Managed Care – PPO | Source: Ambulatory Visit | Attending: Family Medicine | Admitting: Family Medicine

## 2013-12-07 ENCOUNTER — Other Ambulatory Visit: Payer: Self-pay | Admitting: Family Medicine

## 2013-12-07 DIAGNOSIS — Z124 Encounter for screening for malignant neoplasm of cervix: Secondary | ICD-10-CM | POA: Insufficient documentation

## 2013-12-09 ENCOUNTER — Telehealth: Payer: Self-pay

## 2013-12-09 ENCOUNTER — Other Ambulatory Visit: Payer: Self-pay

## 2013-12-09 NOTE — Telephone Encounter (Signed)
PA has been initiated.  Will route to nurse to follow up on.  

## 2013-12-30 ENCOUNTER — Telehealth: Payer: Self-pay | Admitting: *Deleted

## 2013-12-30 NOTE — Telephone Encounter (Signed)
We received a fax from pharmacy for [place] on Qvar 46mcg on 12/09/13. Form that was completed at that time was incorrect and we received a return fax providing the contact # [phone] to call to initiate PA. I called this #, pt ID# [id number] and initiated PA. Will await form to be faxed to triage fax.  Bing, CMA

## 2013-12-31 NOTE — Telephone Encounter (Signed)
From completed and placed in dr. Joya Gaskins look-at to sign. [place] Bing, CMA

## 2014-01-04 ENCOUNTER — Telehealth: Payer: Self-pay | Admitting: Critical Care Medicine

## 2014-01-04 NOTE — Telephone Encounter (Signed)
ATC for PA for pt QVAR and was advised will need to call back bc they are having technical difficulties. Called [phone] Member #: [id number] [facility]

## 2014-01-04 NOTE — Telephone Encounter (Signed)
Pt is out of this med & would like to taken care of asap.  Is it possible to get a sample while awaiting PA?

## 2014-01-04 NOTE — Telephone Encounter (Signed)
Per 4.1.15 phone note: Lilli Few, CMA at 12/31/2013 4:24 PM    Status: Signed       From completed and placed in dr. Joya Gaskins look-at to sign. Ambia Bing, [place] Castillo, CMA at 12/30/2013 10:03 AM     Status: Signed        We received a fax from pharmacy for [place] on Qvar 50mcg on 12/09/13. Form that was completed at that time was incorrect and we received a return fax providing the contact # [phone] to call to initiate PA. I called this #, pt ID# [id number] and initiated PA. Will await form to be faxed to triage fax. [place] Bing, CMA   Called [employer] at the number below to attempt PA for pt's QVAR.  Was told by rep Dorothey Baseman. that this cannot be done over the phone; the form must be signed and faxed.  We also have no samples of QVAR 65mcg in the office.  Called spoke with patient and informed her of the above.  Apologized to patient that we do not have any samples of her strength of QVAR.  Pt asked if she could have sample of the QVAR 36mcg and I advised pt must get PW's okay for this.  Pt has been out of her medication for 2-3 days now and has been supplementing with her rescue inhaler prn.   PA form given to Crystal to have PW sign tomorrow.  Dr Joya Gaskins please advise on the sample of QVAR 70mcg for pt and instructions for use.  Thank you.

## 2014-01-04 NOTE — Telephone Encounter (Signed)
Duplicate message. 

## 2014-01-04 NOTE — Telephone Encounter (Signed)
i will review and sign

## 2014-01-05 MED ORDER — BECLOMETHASONE DIPROPIONATE 80 MCG/ACT IN AERS: 1.0000 | INHALATION_SPRAY | Freq: Two times a day (BID) | RESPIRATORY_TRACT | Status: DC

## 2014-01-05 NOTE — Telephone Encounter (Signed)
Please see phone msg from 01/04/14 for additional information.

## 2014-01-05 NOTE — Telephone Encounter (Signed)
PW has signed PA for form qvar 40 mcg.  I have faxed this to [facility] of [place] with Urgent request written on it considering pt is out of medication. I spoke with Dr. Joya Gaskins.  He is ok with giving pt a sample of qvar 80.  She will need to take only 1 puff bid of this strength.   As I am offsite, will route msg to triage.  Can you pls arrange this for pt and let her know? Thank you.

## 2014-01-05 NOTE — Telephone Encounter (Signed)
Spoke with pt. Aware we have faxed PA. She is coming to [facility] office today to pick up sample QVAR 80 mcg. She is aware adn verbalized understanding on this dosage to inahle 1 puff BID only. Will forward message back toc rystal and sample left for pick up.

## 2014-01-06 NOTE — Telephone Encounter (Signed)
Received fax from [facility] of [place] stating they need clinical rational question #4 completed.  I have completed this and faxed this back along with last few OV notes.  Urgent Request is written on fax.  Will await decision.

## 2014-01-08 NOTE — Telephone Encounter (Signed)
Called, spoke with pt.  Explained below to her.  She verbalized understanding.  Pt states she still has plenty of the qvar sample we had given her earlier.  Pt has a pending OV with Dr. Joya Gaskins on January 22, 2014.  She is requesting to hold off on having rx sent or picking up samples at this time.  She is requesting to wait until her appt as she has enough qvar to last until then.  During her appt with PW on April 24, pt would like to pick up sample and been shown Asmanex technique.  Advised this would be ok.  Will update med list and include this in pt's appt notes.

## 2014-01-08 NOTE — Telephone Encounter (Signed)
We received a DENIAL form [employer] of [place] for qvar.  Pt must try and fail 2 therapeutic equivalent drugs.  Preferred are Asmanex and Flovent. Per PW: ok to change qvar to Asmanex 2 puffs once daily.

## 2014-01-15 ENCOUNTER — Telehealth: Payer: Self-pay | Admitting: Critical Care Medicine

## 2014-01-15 MED ORDER — MOMETASONE FUROATE 220 MCG/INH IN AEPB: 2.0000 | INHALATION_SPRAY | Freq: Every day | RESPIRATORY_TRACT | Status: DC

## 2014-01-15 NOTE — Telephone Encounter (Signed)
Spoke with pt. She reports we have had to r/s her appt w/ PW. She will run out of QVAR and so she needs to be shown how to use the asmanex until her may appt. She will come next week to pick up samples and be shown how to use it. Nothing further needed

## 2014-01-21 NOTE — Telephone Encounter (Signed)
Pt came by today to pick up asmanex samples. She has been shown this technique and verbalized understanding.  She is aware to call back if she has any problems with the asmanex or if breathing worsens with it.

## 2014-01-22 ENCOUNTER — Ambulatory Visit: Payer: BC Managed Care – PPO | Admitting: Critical Care Medicine

## 2014-02-26 ENCOUNTER — Ambulatory Visit: Payer: BC Managed Care – PPO | Admitting: Critical Care Medicine

## 2014-03-24 ENCOUNTER — Ambulatory Visit (INDEPENDENT_AMBULATORY_CARE_PROVIDER_SITE_OTHER): Payer: BC Managed Care – PPO | Admitting: Critical Care Medicine

## 2014-03-24 ENCOUNTER — Encounter: Payer: Self-pay | Admitting: Critical Care Medicine

## 2014-03-24 VITALS — BP 114/70 | HR 60 | Temp 98.0°F | Ht 64.0 in | Wt 165.6 lb

## 2014-03-24 DIAGNOSIS — J45909 Unspecified asthma, uncomplicated: Secondary | ICD-10-CM

## 2014-03-24 MED ORDER — FLUNISOLIDE HFA 80 MCG/ACT IN AERS: 1.0000 | INHALATION_SPRAY | Freq: Two times a day (BID) | RESPIRATORY_TRACT | Status: DC

## 2014-03-24 NOTE — Progress Notes (Signed)
Subjective:    Patient ID: Lynn Ashley, female    DOB: January 03, 1961, 53 y.o.   MRN: [id number]  HPI  53 yo WF  with known history of chronic cough. Chronic sinusitis/ moderate persistent asthma/chronic rhinits.    07/2013  Acute OV  Complains of dry cough, chest congestion, chest tightness, hoarseness, laryngitis, fatigue, chills onset yesterday evening - reports did some extensive cleaning yesterday.   Not sure if the cleaners aggravated her breathing or she has a cold. Worried she has PNA. Taking care of her dad that has stage 4 lung cancer.  No hemoptysis, chest pain, orthopnea, fever or edema.  CXR today shows no acute process.  Taking QVAR Twice daily with no missed doses.  Using her albuterol inhaler more yesterday.   03/24/2014 Chief Complaint  Patient presents with  . Follow-up    Pt states that breathing has been doing well since last OV. No complaints. Pt states that she has not started Asmanex, still using QVAR. Requests to stay on this medication d/t good reponse.  Pt doing well, now off nexium . Has lost weight and walking Occ use of rescue inhaler with the heat.  Pt wants to stay on Qvar.     Review of Systems  Constitutional:   No  weight loss, night sweats,  Fevers,  +chills, fatigue, or  lassitude.  HEENT:   No headaches,  Difficulty swallowing,  Tooth/dental problems, or  + Sore throat,                No sneezing, itching, ear ache,  +nasal congestion, post nasal drip,   CV:  No chest pain,  Orthopnea, PND, swelling in lower extremities, anasarca, dizziness, palpitations, syncope.   GI  No heartburn, indigestion, abdominal pain, nausea, vomiting, diarrhea, change in bowel habits, loss of appetite, bloody stools.   Resp: No shortness of breath with exertion or at rest.  No excess mucus, no productive cough,  No non-productive cough,  No coughing up of blood.  No change in color of mucus.  No wheezing.  No chest wall deformity  Skin: no rash or  lesions.  GU: no dysuria, change in color of urine, no urgency or frequency.  No flank pain, no hematuria   MS:  No joint pain or swelling.  No decreased range of motion.  No back pain.  Psych:  No change in mood or affect. No depression or anxiety.  No memory loss.       Objective:   Physical Exam  GEN: A/Ox3; pleasant , NAD, well nourished   HEENT:  [place]/AT,  EACs-clear, TMs-wnl, NOSE-clear drainage , THROAT-clear, no lesions, no postnasal drip or exudate noted.   NECK:  Supple w/ fair ROM; no JVD; normal carotid impulses w/o bruits; no thyromegaly or nodules palpated; no lymphadenopathy.  RESP  Clear  P & A; w/o, wheezes/ rales/ or rhonchi.no accessory muscle use, no dullness to percussion  CARD:  RRR, no m/r/g  , no peripheral edema, pulses intact, no cyanosis or clubbing.  GI:   Soft & nt; nml bowel sounds; no organomegaly or masses detected.  Musco: Warm bil, no deformities or joint swelling noted.   Neuro: alert, no focal deficits noted.    Skin: Warm, no lesions or rashes     Assessment & Plan:   Moderate persistent asthma Mod persistent asthma Plan Cont inhaled steroids   Updated Medication List Outpatient Encounter Prescriptions as of 03/24/2014  Medication Sig  . albuterol (PROAIR HFA)  108 (90 BASE) MCG/ACT inhaler Inhale 2 puffs into the lungs every 6 (six) hours as needed.  . benzonatate (TESSALON) 200 MG capsule Take 1 capsule (200 mg total) by mouth 3 (three) times daily as needed for cough.  . esomeprazole (NEXIUM) 40 MG capsule Take 40 mg by mouth every other day.   . levothyroxine (SYNTHROID, LEVOTHROID) 50 MCG tablet Take 50 mcg by mouth daily.    . Multiple Vitamins-Minerals (MULTIVITAMIN GUMMIES ADULT PO) Take 1 tablet by mouth daily.    Marland Kitchen NASAL [place] NA Place into the nose as needed.    Lenard Forth Triphasic (NORGESTIMATE-ETHINYL ESTRADIOL PO) Take by mouth daily.   . sodium chloride (OCEAN) 0.65 % nasal spray Place 1 spray into the nose  daily.   . [DISCONTINUED] beclomethasone (QVAR) 80 MCG/ACT inhaler Inhale 1 puff into the lungs 2 (two) times daily.  . Flunisolide HFA (AEROSPAN) 80 MCG/ACT AERS Inhale 1 puff into the lungs 2 (two) times daily.  . mometasone (ASMANEX) 220 MCG/INH inhaler Inhale 2 puffs into the lungs daily. Pt will need sample/rx and be shown technique during April OV.  . [DISCONTINUED] predniSONE (DELTASONE) 10 MG tablet 4 tabs for 2 days, then 3 tabs for 2 days, 2 tabs for 2 days, then 1 tab for 2 days, then stop

## 2014-03-24 NOTE — Assessment & Plan Note (Signed)
Mod persistent asthma Plan Cont inhaled steroids

## 2014-03-24 NOTE — Patient Instructions (Signed)
Use aerospan one puff twice a day until samples run out then you will need to switch to asmanex Return 6 months

## 2014-05-04 ENCOUNTER — Other Ambulatory Visit: Payer: Self-pay | Admitting: Dermatology

## 2014-05-25 ENCOUNTER — Other Ambulatory Visit: Payer: Self-pay

## 2014-05-25 DIAGNOSIS — Z1231 Encounter for screening mammogram for malignant neoplasm of breast: Secondary | ICD-10-CM

## 2014-06-09 ENCOUNTER — Ambulatory Visit: Payer: BC Managed Care – PPO

## 2014-06-18 ENCOUNTER — Ambulatory Visit
Admission: RE | Admit: 2014-06-18 | Discharge: 2014-06-18 | Disposition: A | Discharge status: Home / Self Care | Payer: BC Managed Care – PPO | Source: Ambulatory Visit

## 2014-06-18 DIAGNOSIS — Z1231 Encounter for screening mammogram for malignant neoplasm of breast: Secondary | ICD-10-CM

## 2014-06-22 ENCOUNTER — Other Ambulatory Visit: Payer: Self-pay | Admitting: Family Medicine

## 2014-06-22 DIAGNOSIS — R928 Other abnormal and inconclusive findings on diagnostic imaging of breast: Secondary | ICD-10-CM

## 2014-06-29 ENCOUNTER — Ambulatory Visit
Admission: RE | Admit: 2014-06-29 | Discharge: 2014-06-29 | Disposition: A | Discharge status: Home / Self Care | Payer: BC Managed Care – PPO | Source: Ambulatory Visit | Attending: Family Medicine | Admitting: Family Medicine

## 2014-06-29 DIAGNOSIS — R928 Other abnormal and inconclusive findings on diagnostic imaging of breast: Secondary | ICD-10-CM

## 2014-08-09 ENCOUNTER — Telehealth: Payer: Self-pay | Admitting: Critical Care Medicine

## 2014-08-09 DIAGNOSIS — J45901 Unspecified asthma with (acute) exacerbation: Secondary | ICD-10-CM

## 2014-08-09 MED ORDER — FLUNISOLIDE HFA 80 MCG/ACT IN AERS: 1.0000 | INHALATION_SPRAY | Freq: Two times a day (BID) | RESPIRATORY_TRACT | Status: DC

## 2014-08-09 MED ORDER — ALBUTEROL SULFATE HFA 108 (90 BASE) MCG/ACT IN AERS: 2.0000 | INHALATION_SPRAY | Freq: Four times a day (QID) | RESPIRATORY_TRACT | Status: DC | PRN

## 2014-08-09 NOTE — Telephone Encounter (Signed)
Spoke with the pt  She is requesting Aerospan sample  1 sample up front for pick up  No proair samples, so sent rx to her pharm per her request  Nothing further needed

## 2014-11-16 ENCOUNTER — Telehealth: Payer: Self-pay | Admitting: Critical Care Medicine

## 2014-11-16 MED ORDER — BECLOMETHASONE DIPROPIONATE 80 MCG/ACT IN AERS: 2.0000 | INHALATION_SPRAY | Freq: Two times a day (BID) | RESPIRATORY_TRACT | Status: DC

## 2014-11-16 NOTE — Telephone Encounter (Signed)
i am ok with qvar 80 mcg two puff bid

## 2014-11-16 NOTE — Telephone Encounter (Signed)
Spoke with pt. At her last OV (03/2014) PW switched her to [place] from Qvar due to her insurance. She now has insurance that will cover Qvar and would like to switch back. Advised her that an appointment will need to be scheduled because PW wanted to follow up in 08/2014. States that she doesn't need an appointment and just wants her medication changed back.  PW - please advise. Thanks.

## 2014-11-16 NOTE — Telephone Encounter (Signed)
lmtcb x1 

## 2014-11-16 NOTE — Telephone Encounter (Signed)
Pt returning call.Lynn Ashley ° °

## 2014-11-16 NOTE — Telephone Encounter (Signed)
Spoke with pt and advised of Dr Bettina Gavia recommendations.  Sample of Qvar left at front desk and rx sent to pharmacy.

## 2014-11-16 NOTE — Telephone Encounter (Signed)
lmtcb x1 Pt needs ROV with PW. Was to follow up in 08/2014.

## 2014-11-16 NOTE — Telephone Encounter (Signed)
Pt returned call. Wants to talk to nurse.  [phone]

## 2015-01-05 ENCOUNTER — Telehealth: Payer: Self-pay | Admitting: Critical Care Medicine

## 2015-01-05 MED ORDER — BECLOMETHASONE DIPROPIONATE 80 MCG/ACT IN AERS: 2.0000 | INHALATION_SPRAY | Freq: Two times a day (BID) | RESPIRATORY_TRACT | Status: DC

## 2015-01-05 NOTE — Telephone Encounter (Signed)
Spoke with pt, she is aware that sample is up front for pt.  Nothing further needed.

## 2015-04-07 ENCOUNTER — Telehealth: Payer: Self-pay | Admitting: Critical Care Medicine

## 2015-04-07 NOTE — Telephone Encounter (Signed)
Spoke with pt. Needs samples of Qvar. ROV is needed, she has not seen PW since 2015. This has been scheduled for 05/02/15 at 9am. Samples will be left at the front desk. Nothing further was needed.

## 2015-05-02 ENCOUNTER — Encounter: Payer: Self-pay | Admitting: Critical Care Medicine

## 2015-05-02 ENCOUNTER — Ambulatory Visit (INDEPENDENT_AMBULATORY_CARE_PROVIDER_SITE_OTHER): Payer: 59 | Admitting: Critical Care Medicine

## 2015-05-02 VITALS — BP 112/84 | HR 63 | Ht 64.0 in | Wt 181.0 lb

## 2015-05-02 DIAGNOSIS — J4540 Moderate persistent asthma, uncomplicated: Secondary | ICD-10-CM | POA: Diagnosis not present

## 2015-05-02 NOTE — Patient Instructions (Signed)
No changes in medications  Use advil as needed Stay on Qvar  Return 1 year

## 2015-05-02 NOTE — Assessment & Plan Note (Signed)
Moderate persistent asthma with significant allergic and environmental factors stable on inhaled steroid Plan Maintain Qvar 80 g 2 puffs twice daily Maintain albuterol as needed Return 6 months

## 2015-05-02 NOTE — Progress Notes (Signed)
Subjective:    Patient ID: Lynn Ashley, female    DOB: [birth year], 54 y.o.   MRN: [id number]  HPI 05/02/2015 Chief Complaint  Patient presents with  . Asthma    Breathing is worse due to the warm weather. Does not walk as much as she used because of the weather.   Notes mild cough.  Pain on right side, sharp pain if work and awakens pt at night ,started when coughing spell. Rx pred pulse one month ago per PCP  Pt denies any significant sore throat, nasal congestion or excess secretions, fever, chills, sweats, unintended weight loss, pleurtic or exertional chest pain, orthopnea PND, or leg swelling Pt denies any increase in rescue therapy over baseline, denies waking up needing it or having any early am or nocturnal exacerbations of coughing/wheezing/or dyspnea. Pt also denies any obvious fluctuation in symptoms with  weather or environmental change or other alleviating or aggravating factors s PUL ASTHMA HISTORY 05/02/2015 09/03/2012 09/03/2011  Symptoms >2 days/week 0-2 days/week 0-2 days/week  Nighttime awakenings 0-2/month 0-2/month 0-2/month  Interference with activity Minor limitations No limitations No limitations  SABA use 0-2 days/wk 0-2 days/wk 0-2 days/wk  Exacerbations requiring oral steroids 0-1 / year 0-1 / year 0-1 / year     Current Medications, Allergies, Complete Past Medical History, Past Surgical History, Family History, and Social History were reviewed in [place] record per todays encounter:  05/02/2015   Review of Systems  Constitutional: Negative.   HENT: Negative.  Negative for ear pain, postnasal drip, rhinorrhea, sinus pressure, sore throat, trouble swallowing and voice change.   Eyes: Negative.   Respiratory: Positive for cough and shortness of breath. Negative for apnea, choking, chest tightness, wheezing and stridor.   Cardiovascular: Positive for chest pain. Negative for palpitations and leg swelling.  Gastrointestinal:  Negative.  Negative for nausea, vomiting, abdominal pain and abdominal distention.  Genitourinary: Negative.   Musculoskeletal: Negative.  Negative for myalgias and arthralgias.  Skin: Negative.  Negative for rash.  Allergic/Immunologic: Negative.  Negative for environmental allergies and food allergies.  Neurological: Negative.  Negative for dizziness, syncope, weakness and headaches.  Hematological: Negative.  Negative for adenopathy. Does not bruise/bleed easily.  Psychiatric/Behavioral: Negative.  Negative for sleep disturbance and agitation. The patient is not nervous/anxious.        Objective:   Physical Exam Filed Vitals:   05/02/15 0913  BP: 112/84  Pulse: 63  Height: 5\' 4"  (1.626 m)  Weight: 181 lb (82.101 kg)  SpO2: 98%    Gen: Pleasant, well-nourished, in no distress,  normal affect  ENT: No lesions,  mouth clear,  oropharynx clear, no postnasal drip  Neck: No JVD, no TMG, no carotid bruits  Lungs: No use of accessory muscles, no dullness to percussion, clear without rales or rhonchi  Cardiovascular: RRR, heart sounds normal, no murmur or gallops, no peripheral edema  Abdomen: soft and NT, no HSM,  BS normal  Musculoskeletal: No deformities, no cyanosis or clubbing  Neuro: alert, non focal  Skin: Warm, no lesions or rashes  No results found.        Assessment & Plan:  I personally reviewed all images and lab data in the [facility] system as well as any outside material available during this office visit and agree with the  radiology impressions.   Asthma, moderate persistent Moderate persistent asthma with significant allergic and environmental factors stable on inhaled steroid Plan Maintain Qvar 80 g 2 puffs twice daily Maintain  albuterol as needed Return 6 months

## 2015-07-06 ENCOUNTER — Other Ambulatory Visit: Payer: Self-pay

## 2015-07-06 DIAGNOSIS — Z1231 Encounter for screening mammogram for malignant neoplasm of breast: Secondary | ICD-10-CM

## 2015-07-08 ENCOUNTER — Other Ambulatory Visit: Payer: Self-pay | Admitting: Family Medicine

## 2015-07-08 DIAGNOSIS — R109 Unspecified abdominal pain: Secondary | ICD-10-CM

## 2015-07-12 ENCOUNTER — Ambulatory Visit
Admission: RE | Admit: 2015-07-12 | Discharge: 2015-07-12 | Disposition: A | Discharge status: Home / Self Care | Payer: 59 | Source: Ambulatory Visit | Attending: Family Medicine | Admitting: Family Medicine

## 2015-07-12 DIAGNOSIS — R109 Unspecified abdominal pain: Secondary | ICD-10-CM

## 2015-07-13 ENCOUNTER — Other Ambulatory Visit: Payer: Self-pay | Admitting: Family Medicine

## 2015-07-13 ENCOUNTER — Other Ambulatory Visit: Payer: 59

## 2015-07-13 DIAGNOSIS — K8689 Other specified diseases of pancreas: Secondary | ICD-10-CM

## 2015-07-14 ENCOUNTER — Ambulatory Visit
Admission: RE | Admit: 2015-07-14 | Discharge: 2015-07-14 | Disposition: A | Discharge status: Home / Self Care | Payer: 59 | Source: Ambulatory Visit | Attending: Family Medicine | Admitting: Family Medicine

## 2015-07-14 ENCOUNTER — Other Ambulatory Visit: Payer: 59

## 2015-07-14 DIAGNOSIS — K8689 Other specified diseases of pancreas: Secondary | ICD-10-CM

## 2015-07-14 MED ORDER — GADOBENATE DIMEGLUMINE 529 MG/ML IV SOLN
17.0000 mL | Freq: Once | INTRAVENOUS | Status: AC | PRN
  Administered 2015-07-14: 17 mL via INTRAVENOUS

## 2015-07-27 ENCOUNTER — Ambulatory Visit: Payer: 59

## 2016-03-01 ENCOUNTER — Telehealth: Payer: Self-pay | Admitting: Critical Care Medicine

## 2016-03-01 DIAGNOSIS — J45901 Unspecified asthma with (acute) exacerbation: Secondary | ICD-10-CM

## 2016-03-01 MED ORDER — ALBUTEROL SULFATE HFA 108 (90 BASE) MCG/ACT IN AERS: 2.0000 | INHALATION_SPRAY | Freq: Four times a day (QID) | RESPIRATORY_TRACT | Status: AC | PRN

## 2016-03-01 NOTE — Telephone Encounter (Signed)
Spoke with pt. She needs a refill sent in for ProAir. Per the recall we had in the pt's chart, she needs to set up with an appointment with BQ. This has been scheduled for 05/21/16 at 4pm. Rx has been sent in. Nothing further was needed.

## 2016-03-16 ENCOUNTER — Other Ambulatory Visit: Payer: Self-pay | Admitting: *Deleted

## 2016-03-16 DIAGNOSIS — J45901 Unspecified asthma with (acute) exacerbation: Secondary | ICD-10-CM

## 2016-05-21 ENCOUNTER — Ambulatory Visit: Payer: Self-pay | Admitting: Pulmonary Disease

## 2016-05-30 ENCOUNTER — Ambulatory Visit
Admission: RE | Admit: 2016-05-30 | Discharge: 2016-05-30 | Disposition: A | Discharge status: Home / Self Care | Payer: BLUE CROSS/BLUE SHIELD | Source: Ambulatory Visit | Attending: Family Medicine | Admitting: Family Medicine

## 2016-05-30 ENCOUNTER — Other Ambulatory Visit: Payer: Self-pay | Admitting: Family Medicine

## 2016-05-30 DIAGNOSIS — M79672 Pain in left foot: Secondary | ICD-10-CM

## 2016-05-31 ENCOUNTER — Encounter: Payer: Self-pay | Admitting: Pulmonary Disease

## 2016-05-31 ENCOUNTER — Ambulatory Visit (INDEPENDENT_AMBULATORY_CARE_PROVIDER_SITE_OTHER): Payer: BLUE CROSS/BLUE SHIELD | Admitting: Pulmonary Disease

## 2016-05-31 DIAGNOSIS — J4540 Moderate persistent asthma, uncomplicated: Secondary | ICD-10-CM

## 2016-05-31 MED ORDER — BECLOMETHASONE DIPROPIONATE 80 MCG/ACT IN AERS: 2.0000 | INHALATION_SPRAY | Freq: Two times a day (BID) | RESPIRATORY_TRACT | 11 refills | Status: DC

## 2016-05-31 NOTE — Assessment & Plan Note (Signed)
This is been a stable interval for her. She has not had an exacerbation despite her recent aggressive therapy for pancreatic cancer.  Plan: Continue Qvar Continue as needed albuterol She chooses to get a flu shot through her oncology team who is coordinating all of her immunizations Follow-up 6 months

## 2016-05-31 NOTE — Addendum Note (Signed)
Addended by: Len Blalock on: 05/31/2016 03:53 PM   Modules accepted: Orders

## 2016-05-31 NOTE — Patient Instructions (Signed)
Keep taking your Qvar as you were doing Get a flu shot from your oncology team We will see you back in 6 months or sooner if needed

## 2016-05-31 NOTE — Progress Notes (Signed)
   Subjective:    Patient ID: Lynn Ashley, female    DOB: September 08, 1961, 55 y.o.   MRN: [id number]  Synopsis: Former patient of Dr. Joya Gaskins who has asthma  HPI Chief Complaint  Patient presents with  . Follow-up    Former PW pt. Pt reports her breathing is fine. Denies any wheezing/chest tightness/cough.    Lynn Ashley had her pancreas removed due to a pancreatic cancer. 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  She is not receiving adjuvant chemotherapy.   Her breathing has been fine during this time, no problems with asthma.  No flare ups since the last visits.  Still taking QVar.    Past Medical History:  Diagnosis Date  . Allergy   . Arthritis   . Asthma   . GERD (gastroesophageal reflux disease)       Review of Systems     Objective:   Physical Exam Vitals:   05/31/16 1527  BP: 124/74  Pulse: (!) 54  SpO2: 98%  Weight: 154 lb 3.2 oz (69.9 kg)  Height: 5\' 4"  (1.626 m)  RA  Gen: well appearing HENT: OP clear, TM's clear, neck supple PULM: CTA B, normal percussion CV: RRR, no mgr, trace edema GI: BS+, soft, nontender Derm: no cyanosis or rash Psyche: normal mood and affect       Assessment & Plan:  Asthma, moderate persistent This is been a stable interval for her. She has not had an exacerbation despite her recent aggressive therapy for pancreatic cancer.  Plan: Continue Qvar Continue as needed albuterol She chooses to get a flu shot through her oncology team who is coordinating all of her immunizations Follow-up 6 months    Current Outpatient Prescriptions:  .  albuterol (PROAIR HFA) 108 (90 Base) MCG/ACT inhaler, Inhale 2 puffs into the lungs every 6 (six) hours as needed., Disp: 1 Inhaler, Rfl: 0 .  beclomethasone (QVAR) 80 MCG/ACT inhaler, Inhale 2 puffs into the lungs 2 (two) times daily., Disp: 1 Inhaler, Rfl: 0 .  cetirizine (ZYRTEC) 10 MG tablet, Take 10 mg by mouth daily., Disp: , Rfl:  .  esomeprazole (NEXIUM) 40 MG capsule, Take 40 mg by  mouth daily. , Disp: , Rfl:  .  insulin aspart (NOVOLOG) 100 UNIT/ML FlexPen, Sliding scale, Disp: , Rfl:  .  LANTUS SOLOSTAR 100 UNIT/ML Solostar Pen, 20 units daily, Disp: , Rfl: 11 .  levothyroxine (SYNTHROID, LEVOTHROID) 50 MCG tablet, Take 50 mcg by mouth daily.  , Disp: , Rfl:  .  Pancrelipase, Lip-Prot-Amyl, 25000 units CPEP, Take by mouth. 2 tablets 3 times a day and 1 tablet with a snack, Disp: , Rfl:  .  benzonatate (TESSALON) 200 MG capsule, Take 1 capsule (200 mg total) by mouth 3 (three) times daily as needed for cough. (Patient not taking: Reported on 05/31/2016), Disp: 30 capsule, Rfl: 1 .  fluticasone (FLONASE) 50 MCG/ACT nasal spray, Place 2 sprays into both nostrils daily., Disp: , Rfl:

## 2016-06-13 ENCOUNTER — Ambulatory Visit (INDEPENDENT_AMBULATORY_CARE_PROVIDER_SITE_OTHER): Payer: BLUE CROSS/BLUE SHIELD | Admitting: Podiatry

## 2016-06-13 ENCOUNTER — Encounter: Payer: Self-pay | Admitting: Podiatry

## 2016-06-13 VITALS — BP 141/79 | HR 58 | Resp 16 | Ht 64.0 in | Wt 147.0 lb

## 2016-06-13 DIAGNOSIS — M722 Plantar fascial fibromatosis: Secondary | ICD-10-CM

## 2016-06-13 DIAGNOSIS — M79672 Pain in left foot: Secondary | ICD-10-CM

## 2016-06-13 MED ORDER — TRIAMCINOLONE ACETONIDE 10 MG/ML IJ SUSP
10.0000 mg | Freq: Once | INTRAMUSCULAR | Status: AC
  Administered 2016-06-13: 10 mg

## 2016-06-13 NOTE — Progress Notes (Signed)
Subjective:     Patient ID: Lynn Ashley, female   DOB: 1961-08-07, 55 y.o.   MRN: [id number]  HPI patient presents stating that she's developed a lot of pain in her left heel and that she's had surgery and extensive treatments for cancer of her pancreas   Review of Systems  All other systems reviewed and are negative.      Objective:   Physical Exam  Constitutional: She is oriented to person, place, and time.  Cardiovascular: Intact distal pulses.   Musculoskeletal: Normal range of motion.  Neurological: She is oriented to person, place, and time.  Skin: Skin is warm.  Nursing note and vitals reviewed.  neurovascular status intact muscle strength adequate range of motion within normal limits with patient found to have exquisite discomfort plantar aspect left heel at the insertional point of the tendon into the calcaneus with inflammation and fluid around the medial band. Patient's found have mild depression of the arch and pain is worse after getting up and when getting up in the a.m.     Assessment:     Acute plantar fasciitis left with changes in activity levels precipitating factor    Plan:     H&P x-rays reviewed and injected the plantar fascial left 3 Milligan Kenalog 5 mill grams Xylocaine and applied fascial brace and dispensed night splint with instructions on use and while sitting. Patient will be reevaluated again in 2 weeks and may require other treatments  X-ray indicates minimal spur formation with no indication of stress fracture or arthritis

## 2016-06-13 NOTE — Progress Notes (Signed)
   Subjective:    Patient ID: Lynn Ashley, female    DOB: 05/09/1961, 55 y.o.   MRN: [id number]  HPI Chief Complaint  Patient presents with  . Foot Pain    Left heel x 2 wks        Review of Systems  All other systems reviewed and are negative.      Objective:   Physical Exam        Assessment & Plan:

## 2016-06-13 NOTE — Patient Instructions (Signed)

## 2016-06-28 ENCOUNTER — Ambulatory Visit (INDEPENDENT_AMBULATORY_CARE_PROVIDER_SITE_OTHER): Payer: BLUE CROSS/BLUE SHIELD | Admitting: Podiatry

## 2016-06-28 ENCOUNTER — Encounter: Payer: Self-pay | Admitting: Podiatry

## 2016-06-28 DIAGNOSIS — M722 Plantar fascial fibromatosis: Secondary | ICD-10-CM

## 2016-06-28 NOTE — Progress Notes (Signed)
Subjective:     Patient ID: Lynn Ashley, female   DOB: 10/18/1960, 55 y.o.   MRN: [id number]  HPI patient states I'm doing better with continued discomfort in the plantar heel but improved and also noted to have depression of the arch with patient planning on going on vacation to [place] and admits she's not been as active as she should be due to the heel pain   Review of Systems     Objective:   Physical Exam Neurovascular status intact with diminishment of discomfort plantar aspect left heel with slight to moderate fluid still accumulated and also moderate depression of the arch noted    Assessment:     Plantar fasciitis improved but concerned about mechanical function of the arch    Plan:     H&P conditions reviewed and scanned for custom orthotics to reduce stress against the heels and a Berkley type with a deep heel cup of approximately 12 mm. Gave instructions on shoe gear modifications and not going barefoot

## 2016-07-06 ENCOUNTER — Ambulatory Visit (INDEPENDENT_AMBULATORY_CARE_PROVIDER_SITE_OTHER): Payer: BLUE CROSS/BLUE SHIELD | Admitting: Podiatry

## 2016-07-06 ENCOUNTER — Encounter: Payer: Self-pay | Admitting: Podiatry

## 2016-07-06 DIAGNOSIS — M722 Plantar fascial fibromatosis: Secondary | ICD-10-CM | POA: Diagnosis not present

## 2016-07-06 NOTE — Patient Instructions (Signed)

## 2016-07-08 NOTE — Progress Notes (Signed)
Subjective:     Patient ID: Lynn Ashley, female   DOB: Mar 06, 1961, 55 y.o.   MRN: [id number]  HPI patient presents stating my heel pain is improving and I'm here for orthotics   Review of Systems     Objective:   Physical Exam Neurovascular status intact with depression of the arch noted and diminishment of fascial pain    Assessment:     Improved fasciitis still present    Plan:     Orthotics dispensed and discussed second pair for different types of activity. Patient wants to have these made and we will try to modify them so she can wear them anymore shoes and we will let her know when they're here

## 2016-07-09 ENCOUNTER — Encounter: Payer: BLUE CROSS/BLUE SHIELD | Admitting: Podiatry

## 2016-07-23 ENCOUNTER — Encounter: Payer: BLUE CROSS/BLUE SHIELD | Admitting: Podiatry

## 2016-11-21 ENCOUNTER — Other Ambulatory Visit: Payer: Self-pay

## 2016-11-29 ENCOUNTER — Telehealth: Payer: Self-pay | Admitting: Pulmonary Disease

## 2016-11-29 MED ORDER — BECLOMETHASONE DIPROP HFA 80 MCG/ACT IN AERB: 2.0000 | INHALATION_SPRAY | Freq: Two times a day (BID) | RESPIRATORY_TRACT | 11 refills | Status: DC

## 2016-11-29 NOTE — Telephone Encounter (Signed)
Pt's pharmacy called and stated that pt's Qvar HFA will need to be changed to [place] or to another medication.  BQ - please advise. Thanks.

## 2016-11-29 NOTE — Telephone Encounter (Signed)
The patient needs to check with her insurance formulary to see if the QVar redihaler is covered.  I'm OK with making the change if it is.

## 2016-11-29 NOTE — Telephone Encounter (Signed)
Called and spoke with Lynn Ashley at pharmacy, verified that qvar is covered by pt insurance.  rx called in to pharmacy.  Spoke with pt to make aware of different qvar delivery system.  Pt expressed understanding.  Nothing further needed.

## 2017-03-08 ENCOUNTER — Telehealth: Payer: Self-pay | Admitting: Pulmonary Disease

## 2017-03-08 NOTE — Telephone Encounter (Signed)
Spoke with pt. States that she is not feeling well. Reports cough, chest congestion, chest tightness. Cough is non productive. Denies wheezing, SOB, fever/chills/sweats. Symptoms started 24 hours ago. Would like to have something sent in as we do not have any available appointments today.  MW - please advise as BQ is not available today. Thanks.

## 2017-03-08 NOTE — Telephone Encounter (Signed)
Patient called states we tried to return her call - she is scheduled with pcp but would like to see pulmonology -pt can be reached at [phone] -pr

## 2017-03-08 NOTE — Telephone Encounter (Signed)
Spoke with pt. Advised her that we are still waiting here back from [facility]. States that she has an appointment with PCP but would rather not see them.  MW - please advise. Thanks.

## 2017-03-08 NOTE — Telephone Encounter (Signed)
Spoke with pt. She is aware of MW's recommendation. Nothing further was needed. 

## 2017-03-08 NOTE — Telephone Encounter (Signed)
Not seen since 05/2016 so would need ov to rx  rec pcp or uc

## 2017-12-06 ENCOUNTER — Telehealth: Payer: Self-pay | Admitting: Pulmonary Disease

## 2017-12-06 NOTE — Telephone Encounter (Signed)
Pt last seen 05/31/16 and was instructed and pt to f/u in 36mo. No apt was made. I have made Gerald Stabs with [facility] aware of this information.  lmtcb x1 for pt to schedule apt

## 2017-12-09 NOTE — Telephone Encounter (Signed)
ATC pt, no answer. Left message for pt to call back.  

## 2017-12-10 NOTE — Telephone Encounter (Signed)
Left message for patient to call back. If she calls back, please get her scheduled with BQ asap so she can receive her refill. Thanks!

## 2017-12-11 NOTE — Telephone Encounter (Signed)
Spoke with pt. Advised her that we could not refill her medication until she was seen by Dr. Lake Bells. Pt has been scheduled to see him on 12/12/17 at 9:15am. Nothing further was needed.

## 2017-12-12 ENCOUNTER — Ambulatory Visit (INDEPENDENT_AMBULATORY_CARE_PROVIDER_SITE_OTHER): Payer: BLUE CROSS/BLUE SHIELD | Admitting: Pulmonary Disease

## 2017-12-12 ENCOUNTER — Encounter: Payer: Self-pay | Admitting: Pulmonary Disease

## 2017-12-12 VITALS — BP 124/80 | HR 56 | Ht 64.0 in | Wt 143.0 lb

## 2017-12-12 DIAGNOSIS — J4530 Mild persistent asthma, uncomplicated: Secondary | ICD-10-CM

## 2017-12-12 NOTE — Patient Instructions (Signed)
Mild persistent asthma: Continue Qvar 2 puffs twice a day Let us know if you have any difficulty breathing Let us know if you need a refill on albuterol which you should use 2 puffs every 4-6 hours as needed for chest tightness wheezing or shortness of breath Practice good hand hygiene this time a year  We will see back in 1 year or sooner if needed

## 2017-12-12 NOTE — Progress Notes (Signed)
n  Subjective:    Patient ID: Lynn Ashley, female    DOB: [birth year], 57 y.o.   MRN: [id number]  Synopsis: Former patient of Dr. Joya Gaskins who has asthma; also has a history of pancreatic cancer and had a Whipple  HPI Chief Complaint  Patient presents with  . Follow-up    doing well no problems at this time.   Apparently her pancreatic cancer returned in the lung.  She had a VATS biopsy and started chem, now she is in a clinical trial.  She is BRCA positive and apparently this allows her to take some PARP inhibitor.  She says that her asthma has been well controlled.  She is walking 2-3 miles a day.  No difficulty with chest tightness wheezing or shortness of breath  Past Medical History:  Diagnosis Date  . Allergy   . Arthritis   . Asthma   . GERD (gastroesophageal reflux disease)       Review of Systems     Objective:   Physical Exam Vitals:   12/12/17 0917  BP: 124/80  Pulse: (!) 56  SpO2: 100%  Weight: 143 lb (64.9 kg)  Height: _0  (1.626 m)  RA  Gen: well appearing HENT: OP clear, TM's clear, neck supple PULM: CTA B, normal percussion CV: RRR, no mgr, trace edema GI: BS+, soft, nontender Derm: no cyanosis or rash Psyche: normal mood and affect       Assessment & Plan:   Mild persistent asthma without complication  Discussion: Her asthma has been stable despite the dramatic story she has had with her pancreatic cancer.  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  I have encouraged her to continue to stay active, continue Qvar and let us know if she has any difficulty.  Mild persistent asthma: Continue Qvar 2 puffs twice a day Let us know if you have any difficulty breathing Let us know if you need a refill on albuterol which you should use 2 puffs every 4-6 hours as needed for chest tightness wheezing or shortness of breath Practice good hand hygiene this time a year  We will see  back in 1 year or sooner if needed    Current Outpatient Medications:  .  Beclomethasone Diprop HFA (QVAR REDIHALER) 80 MCG/ACT AERB, Inhale 2 puffs into the lungs 2 (two) times daily., Disp: 10.6 g, Rfl: 11 .  benzonatate (TESSALON) 200 MG capsule, Take 1 capsule (200 mg total) by mouth 3 (three) times daily as needed for cough., Disp: 30 capsule, Rfl: 1 .  cetirizine (ZYRTEC) 10 MG tablet, Take 10 mg by mouth daily., Disp: , Rfl:  .  esomeprazole (NEXIUM) 40 MG capsule, Take 40 mg by mouth daily. , Disp: , Rfl:  .  fluticasone (FLONASE) 50 MCG/ACT nasal spray, Place 2 sprays into both nostrils daily., Disp: , Rfl:  .  insulin aspart (NOVOLOG) 100 UNIT/ML FlexPen, Sliding scale, Disp: , Rfl:  .  levothyroxine (SYNTHROID, LEVOTHROID) 50 MCG tablet, Take 50 mcg by mouth daily.  , Disp: , Rfl:  .  Pancrelipase, Lip-Prot-Amyl, 25000 units CPEP, Take by mouth. 2 tablets 3 times a day and 1 tablet with a snack, Disp: , Rfl:  .  albuterol (PROAIR HFA) 108 (90 Base) MCG/ACT inhaler, Inhale 2 puffs into the lungs every 6 (six) hours as needed., Disp: 1 Inhaler, Rfl: 0

## 2017-12-20 ENCOUNTER — Encounter (INDEPENDENT_AMBULATORY_CARE_PROVIDER_SITE_OTHER): Payer: Self-pay | Admitting: Orthopaedic Surgery

## 2017-12-20 ENCOUNTER — Other Ambulatory Visit (INDEPENDENT_AMBULATORY_CARE_PROVIDER_SITE_OTHER): Payer: Self-pay | Admitting: Radiology

## 2017-12-20 ENCOUNTER — Ambulatory Visit (INDEPENDENT_AMBULATORY_CARE_PROVIDER_SITE_OTHER): Payer: BLUE CROSS/BLUE SHIELD | Admitting: Orthopaedic Surgery

## 2017-12-20 ENCOUNTER — Ambulatory Visit (INDEPENDENT_AMBULATORY_CARE_PROVIDER_SITE_OTHER): Payer: Self-pay

## 2017-12-20 VITALS — BP 138/76 | HR 55 | Resp 16 | Ht 64.0 in | Wt 135.0 lb

## 2017-12-20 DIAGNOSIS — G8929 Other chronic pain: Secondary | ICD-10-CM | POA: Diagnosis not present

## 2017-12-20 DIAGNOSIS — M25511 Pain in right shoulder: Secondary | ICD-10-CM

## 2017-12-20 NOTE — Progress Notes (Signed)
Office Visit Note   Patient: Lynn Ashley           Date of Birth: 11/11/1960           MRN: [id number] Visit Date: 12/20/2017              Requested by: Darcus Austin, MD [place]  [address], [place] [address] PCP: Darcus Austin, MD   Assessment & Plan: Visit Diagnoses:  1. Chronic right shoulder pain     Plan: Right shoulder pain with numerous potential etiologies.  I am concerned she may have a small rotator cuff tear.  After much discussion we will proceed with an MRI scan of the right shoulder and return to the office shortly thereafter  Follow-Up Instructions: No follow-ups on file.   Orders:  Orders Placed This Encounter  Procedures  . XR Shoulder Right   No orders of the defined types were placed in this encounter.     Procedures: No procedures performed   Clinical Data: No additional findings.   Subjective: Chief Complaint  Patient presents with  . Right Shoulder - Pain    Right arm pain off and on for over year. Seen ortho dr 1 yr ago he said he thought it was rotator cuff. Pt feels he didn't listen to her, gave her a cortisone shot. Not sure if it worked bc she was battling cancer and had surgery afterwards.  Onset of right shoulder pain approximately a year ago.  Seen by Dr. Noemi Chapel with a "shot of cortisone."  Not sure it made much of a difference.  Having recurrent symptoms to the point of compromise.  Having difficulty raising her arm over her head and "rotating to the the outside".no numbness or tingling.  No referred pain distal to the mid arm. History of pancreatic cancer with recurrence being treated at the [facility].  Presently in remission  HPI  Review of Systems  Constitutional: Negative for fatigue and fever.  HENT: Negative for ear pain.   Eyes: Negative for pain.  Respiratory: Negative for cough and choking.   Cardiovascular: Negative for leg swelling.  Gastrointestinal: Negative for blood in  stool, constipation and diarrhea.  Genitourinary: Negative for difficulty urinating.  Musculoskeletal: Positive for neck pain. Negative for back pain.  Skin: Negative for rash and wound.  Allergic/Immunologic: Positive for food allergies.  Neurological: Positive for weakness and numbness. Negative for dizziness, light-headedness and headaches.  Hematological: Does not bruise/bleed easily.  Psychiatric/Behavioral: Positive for sleep disturbance.     Objective: Vital Signs: BP 138/76 (BP Location: Left Arm, Patient Position: Sitting, Cuff Size: Normal)   Pulse (!) 55   Resp 16   Ht 5\' 4"  (1.626 m)   Wt 135 lb (61.2 kg)   BMI 23.17 kg/m   Physical Exam  Ortho Exam awake alert and oriented x3.  Comfortable sitting.  Very pleasant.  Able to place right arm overhead with a minimally circuitous arc of motion.  Minimally positive testing.  Positive testing.  No weakness.  Biceps intact.  With abduction in the anterior and lateral subacromial region.  Skin intact.  No pain with range of motion of cervical spine  Specialty Comments:  No specialty comments available.  Imaging: No results found.   PMFS History: Patient Active Problem List   Diagnosis Date Noted  . ALLERGIC RHINITIS 12/23/2007  . Asthma, moderate persistent 12/23/2007  . HYPOTHYROIDISM 12/09/2007  . GASTROESOPHAGEAL REFLUX DISEASE, CHRONIC 12/09/2007  . OSTEOARTHRITIS 12/09/2007  .  SKIN CANCER, HX OF 12/09/2007   Past Medical History:  Diagnosis Date  . Allergy   . Arthritis   . Asthma   . Cancer ([place])   . GERD (gastroesophageal reflux disease)     Family History  Problem Relation Age of Onset  . Melanoma Unknown   . Cancer Mother   . Hypertension Father   . Melanoma Father   . Cancer Father   . Heart disease Brother     Past Surgical History:  Procedure Laterality Date  . arthroscopic of rt knee    . PANCREATICODUODENECTOMY    . 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  sterno-clavicular  surg after dislocastion     Social History   Occupational History  . Not on file  Tobacco Use  . Smoking status: Former Smoker    Packs/day: 2.00    Years: 4.00    Pack years: 8.00    Types: Cigarettes    Last attempt to quit: 04/04/1984    Years since quitting: 33.7  . Smokeless tobacco: Never Used  Substance and Sexual Activity  . Alcohol use: Yes    Comment: rare  . Drug use: No  . Sexual activity: Not on file

## 2018-01-03 ENCOUNTER — Telehealth (INDEPENDENT_AMBULATORY_CARE_PROVIDER_SITE_OTHER): Payer: Self-pay | Admitting: Orthopaedic Surgery

## 2018-01-03 NOTE — Telephone Encounter (Signed)
The order was approved with [facility] since 12/20/17, not sure why she wasn't called by imaging.

## 2018-01-03 NOTE — Telephone Encounter (Signed)
Patient left a voicemail stating that Dr. Durward Fortes referred her for an MRI for her right shoulder and she has not been scheduled yet.  Patient states she is in a lot of pain and doesn't know if she needs to call and schedule or if their office will call her.

## 2018-01-03 NOTE — Telephone Encounter (Signed)
I called patient, patient wants to go to [facility] for MRI. Can you please change the location? Thank you.

## 2018-01-05 ENCOUNTER — Ambulatory Visit
Admission: RE | Admit: 2018-01-05 | Discharge: 2018-01-05 | Disposition: A | Discharge status: Home / Self Care | Payer: BLUE CROSS/BLUE SHIELD | Source: Ambulatory Visit | Attending: Orthopaedic Surgery | Admitting: Orthopaedic Surgery

## 2018-01-05 DIAGNOSIS — M25511 Pain in right shoulder: Secondary | ICD-10-CM

## 2018-01-07 ENCOUNTER — Ambulatory Visit (INDEPENDENT_AMBULATORY_CARE_PROVIDER_SITE_OTHER): Payer: BLUE CROSS/BLUE SHIELD | Admitting: Orthopaedic Surgery

## 2018-01-07 ENCOUNTER — Encounter (INDEPENDENT_AMBULATORY_CARE_PROVIDER_SITE_OTHER): Payer: Self-pay | Admitting: Orthopaedic Surgery

## 2018-01-07 DIAGNOSIS — G8929 Other chronic pain: Secondary | ICD-10-CM | POA: Diagnosis not present

## 2018-01-07 DIAGNOSIS — M25511 Pain in right shoulder: Secondary | ICD-10-CM | POA: Diagnosis not present

## 2018-01-07 NOTE — Progress Notes (Signed)
Office Visit Note   Patient: Lynn Ashley           Date of Birth: [birth year]           MRN: [id number] Visit Date: 01/07/2018              Requested by: Darcus Austin, MD [place] [address], [place] [address] PCP: Darcus Austin, MD   Assessment & Plan: Visit Diagnoses:  1. Chronic right shoulder pain     Plan: MRI scan demonstrates no evidence of a rotator cuff tear.  There is degenerative change at the acromioclavicular joint and mild subacromial bursitis.  Appears to have an old injury to the inferior glenohumeral ligament from a dislocation many years ago.  Long discussion regarding MRI scan findings.  We will try a course of physical therapy and if no improvement consider subacromial cortisone injection  Follow-Up Instructions: Return if symptoms worsen or fail to improve.   Orders:  Orders Placed This Encounter  Procedures  . Ambulatory referral to Physical Therapy   No orders of the defined types were placed in this encounter.     Procedures: No procedures performed   Clinical Data: No additional findings.   Subjective: No chief complaint on file. Mrs. Lynn Ashley has chronic pain referable to her right shoulder as previously identified.  MRI scan was ordered and she is here for the results.  Still having difficulty with overhead motion of her right shoulder with referred discomfort to the deltoid.  No pain referable to her cervical spine. As previously mentioned she has a history of pancreatic cancer and resection of a right lung mass last year Has history of shoulder dislocation at a much younger age without any sensation of instability presently  HPI  Review of Systems   Objective: Vital Signs: There were no vitals taken for this visit.  Physical Exam  Ortho Exam awake alert and oriented x3.  Comfortable sitting.  Examination of the right shoulder reveals mildly positive impingement.  Pain in the subacromial region along the  deltoid laterally and anteriorly with shoulder abduction at 90 degrees.  No popping or clicking.  No pain at the acromioclavicular joint.  Negative cross arm test.  Good strength.  Skin intact.  Good grip and release.  Specialty Comments:  No specialty comments available.  Imaging: No results found.   PMFS History: Patient Active Problem List   Diagnosis Date Noted  . Chronic right shoulder pain 01/07/2018  . ALLERGIC RHINITIS 12/23/2007  . Asthma, moderate persistent 12/23/2007  . HYPOTHYROIDISM 12/09/2007  . GASTROESOPHAGEAL REFLUX DISEASE, CHRONIC 12/09/2007  . OSTEOARTHRITIS 12/09/2007  . SKIN CANCER, HX OF 12/09/2007   Past Medical History:  Diagnosis Date  . Allergy   . Arthritis   . Asthma   . Cancer ([place])   . GERD (gastroesophageal reflux disease)     Family History  Problem Relation Age of Onset  . Melanoma Unknown   . Cancer Mother   . Hypertension Father   . Melanoma Father   . Cancer Father   . Heart disease Brother     Past Surgical History:  Procedure Laterality Date  . arthroscopic of rt knee    . PANCREATICODUODENECTOMY    . 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 sterno-clavicular  surg after dislocastion     Social History   Occupational History  . Not on file  Tobacco Use  . Smoking status:  Former Smoker    Packs/day: 2.00    Years: 4.00    Pack years: 8.00    Types: Cigarettes    Last attempt to quit: 04/04/1984    Years since quitting: 33.7  . Smokeless tobacco: Never Used  Substance and Sexual Activity  . Alcohol use: Yes    Comment: rare  . Drug use: No  . Sexual activity: Not on file     Garald Balding, MD   Note - This record has been created using [employer].  Chart creation errors have been sought, but may not always  have been located. Such creation errors do not reflect on  the standard of medical care.

## 2018-01-13 IMAGING — CR DG FOOT COMPLETE 3+V*L*
3 series · 3 of 3 positions shown · non-contrast
Comparison: None.

CLINICAL DATA: Pain, chronic but intermittent

EXAM:
LEFT FOOT - COMPLETE 3+ VIEW

[view not recorded (1 of 3)]
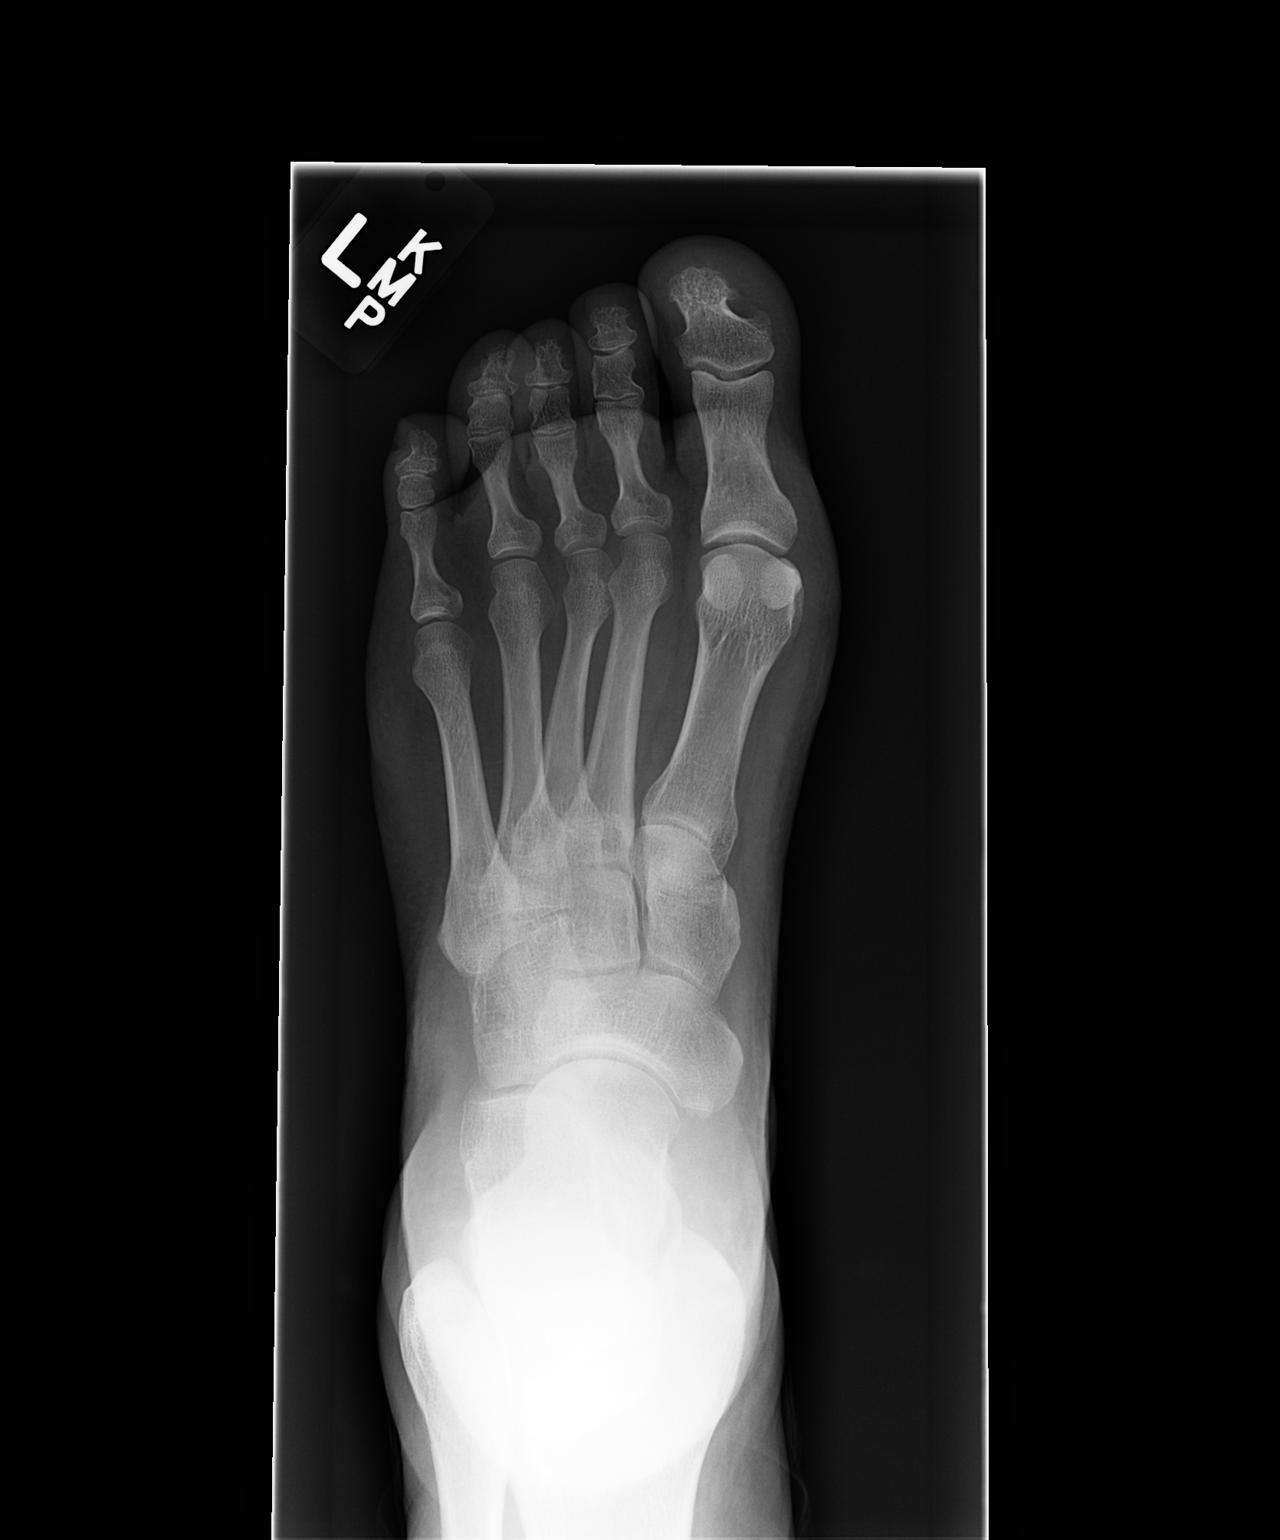

[view not recorded (2 of 3)]
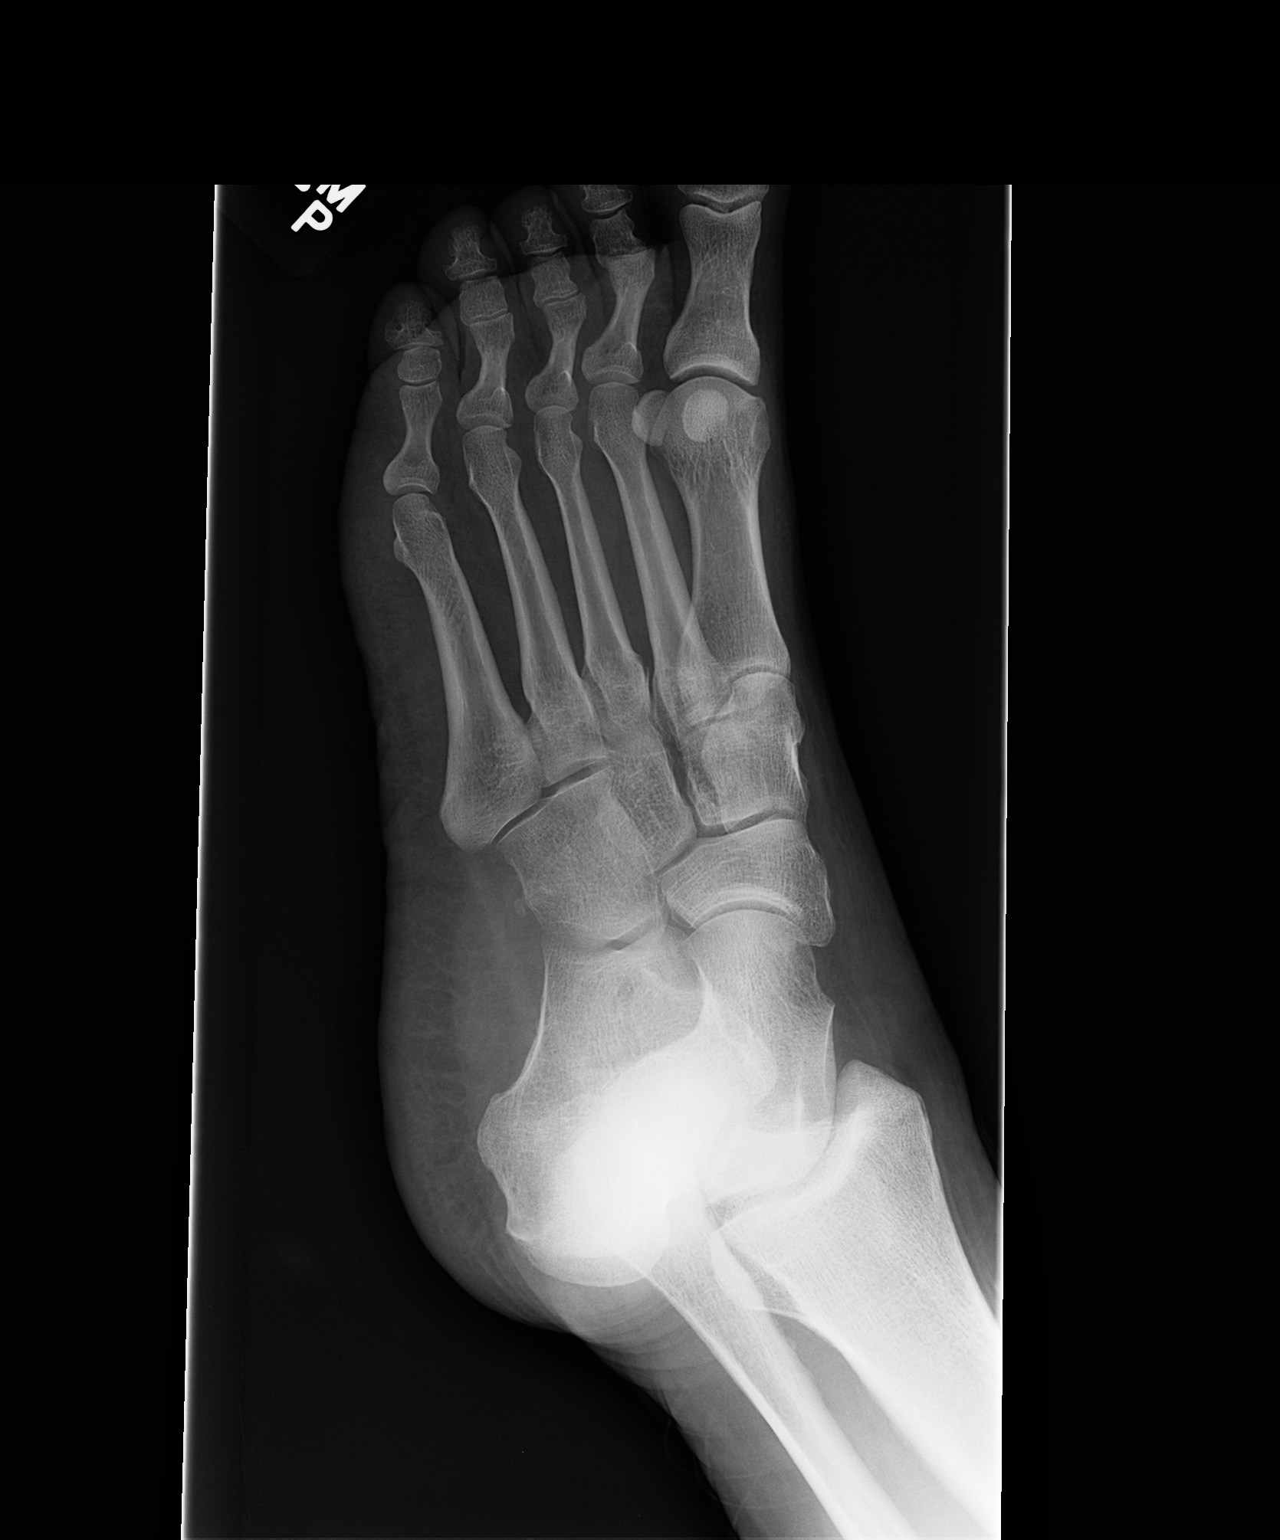

[view not recorded (3 of 3)]
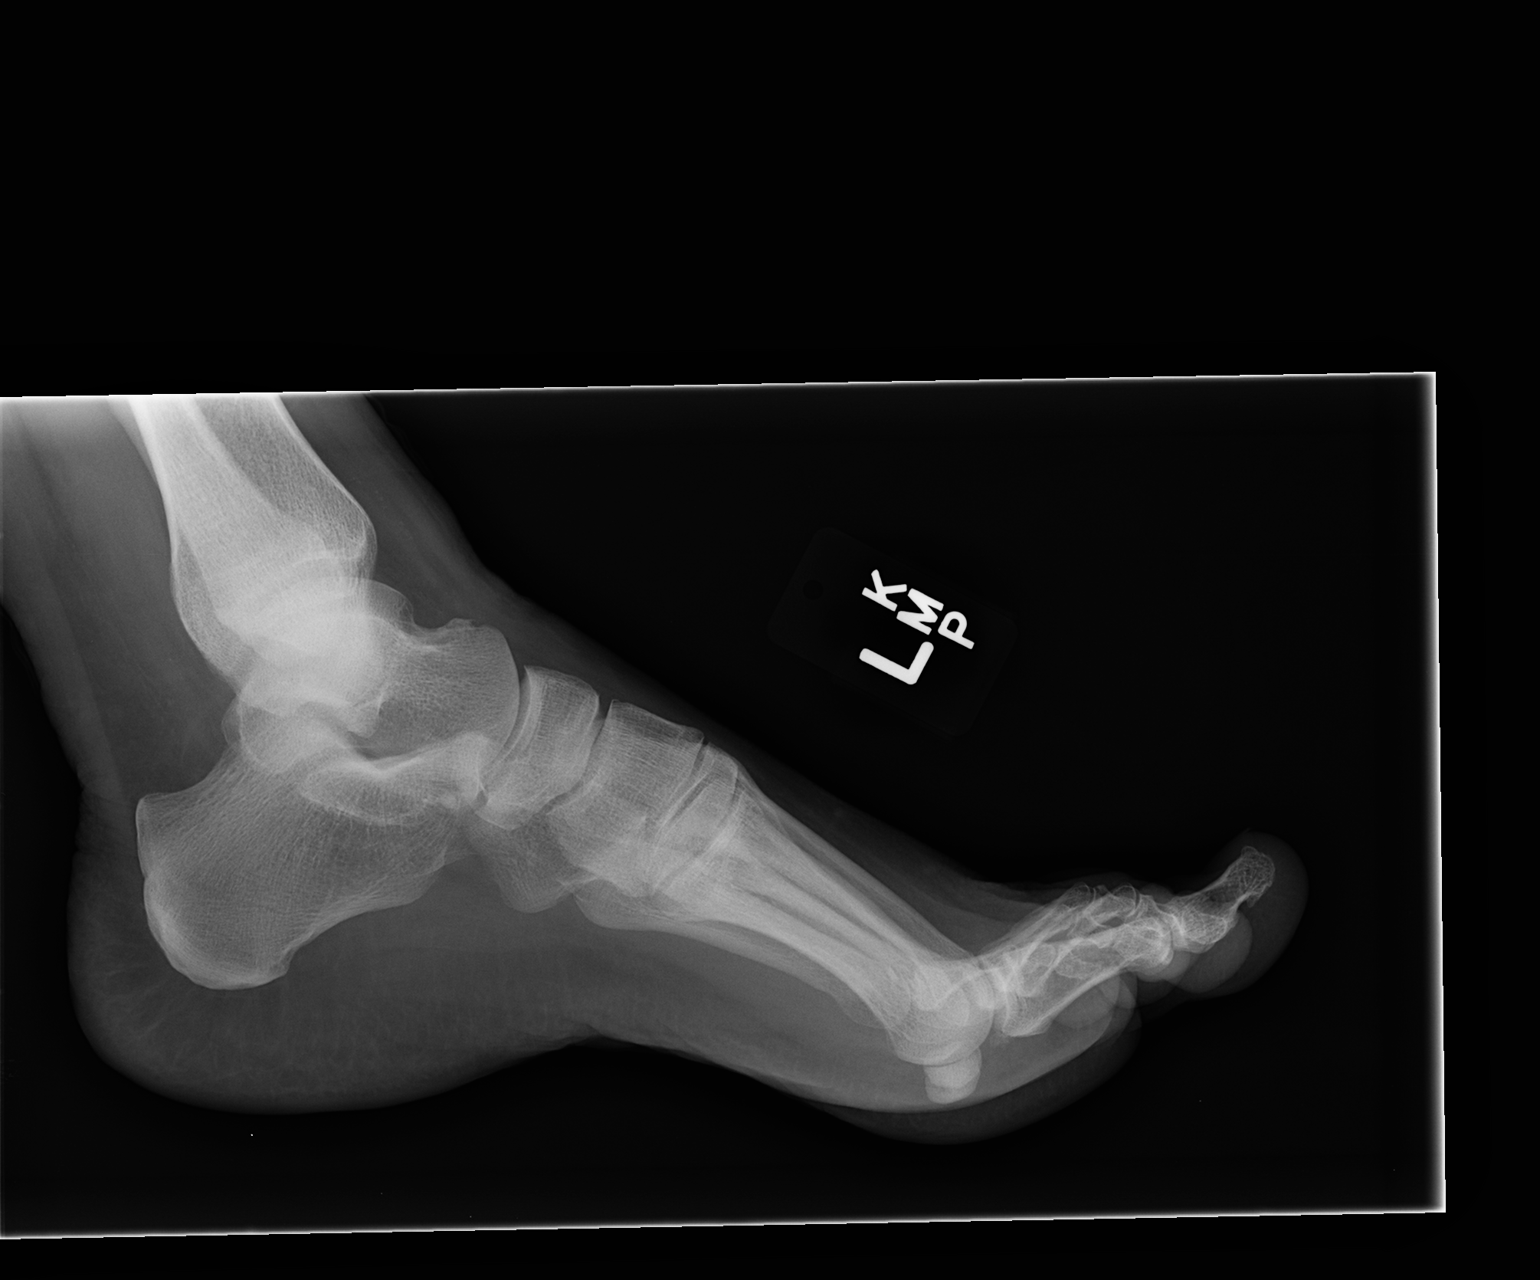

[3 of 3 positions shown; findings below may reference images not displayed]

FINDINGS: Frontal, oblique, and lateral views were obtained. No fracture or
dislocation. The joint spaces appear normal. No erosive change.
There is Nilatsh Tanon.
IMPRESSION: Giovanni Mihajlo Fazlic.  No fracture or dislocation.  No apparent arthropathy.

## 2018-05-22 ENCOUNTER — Other Ambulatory Visit: Payer: Self-pay | Admitting: Pulmonary Disease

## 2019-11-26 ENCOUNTER — Ambulatory Visit: Payer: BLUE CROSS/BLUE SHIELD | Attending: Internal Medicine

## 2019-11-26 DIAGNOSIS — Z23 Encounter for immunization: Secondary | ICD-10-CM | POA: Insufficient documentation

## 2019-11-26 NOTE — Progress Notes (Signed)
   Covid-19 Vaccination Clinic  Name:  Laurie Jeppsen    MRN: [id number] DOB: 01/18/1961  11/26/2019  Ms. Arrilla Armato was observed post Covid-19 immunization for 15 minutes without incidence. She was provided with Vaccine Information Sheet and instruction to access the V-Safe system.   Ms. Sheritta Fiest was instructed to call 911 with any severe reactions post vaccine: Marland Kitchen Difficulty breathing  . Swelling of your face and throat  . A fast heartbeat  . A bad rash all over your body  . Dizziness and weakness    Immunizations Administered    Name Date Dose VIS Date Route   Pfizer COVID-19 Vaccine 11/26/2019 11:25 AM 0.3 mL 09/11/2019 Intramuscular   Manufacturer: [place]   Lot: [id number]   [place]: [id number]

## 2019-12-22 ENCOUNTER — Ambulatory Visit: Payer: BLUE CROSS/BLUE SHIELD | Attending: Internal Medicine

## 2019-12-22 DIAGNOSIS — Z23 Encounter for immunization: Secondary | ICD-10-CM

## 2019-12-22 NOTE — Progress Notes (Signed)
   Covid-19 Vaccination Clinic  Name:  Hermelinda Regas    MRN: [id number] DOB: 1961/08/31  12/22/2019  Ms. Mellie Demarzo was observed post Covid-19 immunization for 30 minutes based on pre-vaccination screening without incident. She was provided with Vaccine Information Sheet and instruction to access the V-Safe system.   Ms. Etna Widner was instructed to call 911 with any severe reactions post vaccine: Marland Kitchen Difficulty breathing  . Swelling of face and throat  . A fast heartbeat  . A bad rash all over body  . Dizziness and weakness   Immunizations Administered    Name Date Dose VIS Date Route   Pfizer COVID-19 Vaccine 12/22/2019 10:47 AM 0.3 mL 09/11/2019 Intramuscular   Manufacturer: [place]   Lot: [id number]   [place]: [id number]

## 2021-07-27 ENCOUNTER — Telehealth: Payer: Self-pay

## 2021-07-27 NOTE — Telephone Encounter (Signed)
Spoke with patient's husband Konrad Dolores to schedule a Palliative Care consult appointment.He requested that I contact the patient directly. Attempted to reach her. No answer left a message to return call.

## 2021-07-27 NOTE — Telephone Encounter (Signed)
Spoke with patient regarding scheduling a Palliative Care consult. She declined PC at this time. She is requested a therapist/counselor. Offered for a SW to call her and she states she will reach out to the counselor she was already established with. Will cancel referral and notify referring provider.

## 2021-11-08 ENCOUNTER — Emergency Department (HOSPITAL_COMMUNITY): Payer: BLUE CROSS/BLUE SHIELD

## 2021-11-08 ENCOUNTER — Emergency Department (HOSPITAL_COMMUNITY)
Admission: EM | Admit: 2021-11-08 | Discharge: 2021-11-08 | Disposition: A | Discharge status: Other Acute Inpatient Hospital | Payer: BLUE CROSS/BLUE SHIELD | Attending: Emergency Medicine | Admitting: Emergency Medicine

## 2021-11-08 DIAGNOSIS — Z8507 Personal history of malignant neoplasm of pancreas: Secondary | ICD-10-CM | POA: Insufficient documentation

## 2021-11-08 DIAGNOSIS — E871 Hypo-osmolality and hyponatremia: Secondary | ICD-10-CM | POA: Insufficient documentation

## 2021-11-08 DIAGNOSIS — E875 Hyperkalemia: Secondary | ICD-10-CM | POA: Diagnosis not present

## 2021-11-08 DIAGNOSIS — Z20822 Contact with and (suspected) exposure to covid-19: Secondary | ICD-10-CM | POA: Diagnosis not present

## 2021-11-08 DIAGNOSIS — E039 Hypothyroidism, unspecified: Secondary | ICD-10-CM | POA: Diagnosis not present

## 2021-11-08 DIAGNOSIS — Z91040 Latex allergy status: Secondary | ICD-10-CM | POA: Insufficient documentation

## 2021-11-08 DIAGNOSIS — R739 Hyperglycemia, unspecified: Secondary | ICD-10-CM | POA: Insufficient documentation

## 2021-11-08 DIAGNOSIS — C7931 Secondary malignant neoplasm of brain: Secondary | ICD-10-CM | POA: Diagnosis not present

## 2021-11-08 DIAGNOSIS — R4182 Altered mental status, unspecified: Secondary | ICD-10-CM | POA: Diagnosis present

## 2021-11-08 DIAGNOSIS — C801 Malignant (primary) neoplasm, unspecified: Secondary | ICD-10-CM | POA: Diagnosis not present

## 2021-11-08 LAB — RAPID URINE DRUG SCREEN, HOSP PERFORMED
Amphetamines: NOT DETECTED
Barbiturates: NOT DETECTED
Benzodiazepines: NOT DETECTED
Cocaine: NOT DETECTED
Opiates: NOT DETECTED
Tetrahydrocannabinol: NOT DETECTED

## 2021-11-08 LAB — CBC
HCT: 39.1 % (ref 36.0–46.0)
Hemoglobin: 12.4 g/dL (ref 12.0–15.0)
MCH: 28.6 pg (ref 26.0–34.0)
MCHC: 31.7 g/dL (ref 30.0–36.0)
MCV: 90.1 fL (ref 80.0–100.0)
Platelets: 229 10*3/uL (ref 150–400)
RBC: 4.34 MIL/uL (ref 3.87–5.11)
RDW: 19.6 % — ABNORMAL HIGH (ref 11.5–15.5)
WBC: 7.4 10*3/uL (ref 4.0–10.5)
nRBC: 0 % (ref 0.0–0.2)

## 2021-11-08 LAB — HEMOGLOBIN A1C
Hgb A1c MFr Bld: 6.8 % — ABNORMAL HIGH (ref 4.8–5.6)
Mean Plasma Glucose: 148.46 mg/dL

## 2021-11-08 LAB — COMPREHENSIVE METABOLIC PANEL
ALT: 41 U/L (ref 0–44)
AST: 61 U/L — ABNORMAL HIGH (ref 15–41)
Albumin: 3.8 g/dL (ref 3.5–5.0)
Alkaline Phosphatase: 73 U/L (ref 38–126)
Anion gap: 13 (ref 5–15)
BUN: 29 mg/dL — ABNORMAL HIGH (ref 6–20)
CO2: 22 mmol/L (ref 22–32)
Calcium: 9.1 mg/dL (ref 8.9–10.3)
Chloride: 96 mmol/L — ABNORMAL LOW (ref 98–111)
Creatinine, Ser: 1.18 mg/dL — ABNORMAL HIGH (ref 0.44–1.00)
GFR, Estimated: 53 mL/min — ABNORMAL LOW (ref >60)
Glucose, Bld: 209 mg/dL — ABNORMAL HIGH (ref 70–99)
Potassium: 5.4 mmol/L — ABNORMAL HIGH (ref 3.5–5.1)
Sodium: 131 mmol/L — ABNORMAL LOW (ref 135–145)
Total Bilirubin: 0.8 mg/dL (ref 0.3–1.2)
Total Protein: 7.2 g/dL (ref 6.5–8.1)

## 2021-11-08 LAB — URINALYSIS, ROUTINE W REFLEX MICROSCOPIC
Bilirubin Urine: NEGATIVE
Glucose, UA: NEGATIVE mg/dL
Ketones, ur: NEGATIVE mg/dL
Leukocytes,Ua: NEGATIVE
Nitrite: NEGATIVE
Protein, ur: NEGATIVE mg/dL
Specific Gravity, Urine: 1.015 (ref 1.005–1.030)
pH: 5 (ref 5.0–8.0)

## 2021-11-08 LAB — RESP PANEL BY RT-PCR (FLU A&B, COVID) ARPGX2
Influenza A by PCR: NEGATIVE
Influenza B by PCR: NEGATIVE
SARS Coronavirus 2 by RT PCR: NEGATIVE

## 2021-11-08 LAB — CBG MONITORING, ED: Glucose-Capillary: 155 mg/dL — ABNORMAL HIGH (ref 70–99)

## 2021-11-08 LAB — TROPONIN I (HIGH SENSITIVITY)
Troponin I (High Sensitivity): 9 ng/L (ref <18)
Troponin I (High Sensitivity): 14 ng/L (ref <18)

## 2021-11-08 LAB — ETHANOL: Alcohol, Ethyl (B): <10 mg/dL (ref <10)

## 2021-11-08 MED ORDER — LORAZEPAM 1 MG PO TABS: 1.0000 mg | ORAL_TABLET | Freq: Once | ORAL | Status: DC

## 2021-11-08 MED ORDER — LORAZEPAM 2 MG/ML IJ SOLN
1.0000 mg | INTRAMUSCULAR | Status: AC | PRN
  Administered 2021-11-08: 1 mg via INTRAVENOUS
  Filled 2021-11-08: qty 1

## 2021-11-08 MED ORDER — ASPIRIN 325 MG PO TABS
325.0000 mg | ORAL_TABLET | Freq: Once | ORAL | Status: AC
  Administered 2021-11-08: 325 mg via ORAL
  Filled 2021-11-08: qty 1

## 2021-11-08 MED ORDER — SODIUM ZIRCONIUM CYCLOSILICATE 10 G PO PACK
10.0000 g | PACK | Freq: Once | ORAL | Status: AC
  Administered 2021-11-08: 10 g via ORAL
  Filled 2021-11-08: qty 1

## 2021-11-08 MED ORDER — DEXAMETHASONE SODIUM PHOSPHATE 10 MG/ML IJ SOLN
10.0000 mg | Freq: Once | INTRAMUSCULAR | Status: AC
  Administered 2021-11-08: 10 mg via INTRAVENOUS
  Filled 2021-11-08: qty 1

## 2021-11-08 MED ORDER — LEVETIRACETAM IN NACL 1500 MG/100ML IV SOLN
1500.0000 mg | Freq: Once | INTRAVENOUS | Status: AC
  Administered 2021-11-08: 1500 mg via INTRAVENOUS
  Filled 2021-11-08: qty 100

## 2021-11-08 NOTE — ED Notes (Signed)
Patient transported to MRI 

## 2021-11-08 NOTE — ED Notes (Signed)
[facility] was called for transport to [facility]

## 2021-11-08 NOTE — ED Triage Notes (Signed)
/  PT BIB [facility] initially called out for sz activity.  On arrival coworker endorsed pt being confused, leaning to left, LOC and shaking.  EMS states it sounded more like syncopal episode. There was no incontinence or mouth trauma.  On EMS arrival pt was awake and confused, demonstrating expressive aphasia. Daughter stated on phone that pt endorsed some left arm tingling this AM.   EMS were unable to perform a neuro exam on scene or for most of transport because of inability to follow commands.  By arrival time pt was A&O with no obvious deficits.  Pt expressed some numbness to left fingers but endorses hx of neuropathy.  Pt also has pancreatic cancer and last tx was Monday.   EMS vitals 190/90 95% HR 90 RR: 16.  CBG 240 on insulin pump. CArdiac strip normal but EMS noted some bigeminy PVC's en route.

## 2021-11-08 NOTE — ED Provider Notes (Addendum)
[facility] EMERGENCY DEPARTMENT Provider Note   CSN: [id number] Arrival date & time: 11/08/21  1546     History  No chief complaint on file.   Lynn Ashley is a 61 y.o. female.  HPI  62 year old female with a history of pancreatic cancer, on chemotherapy, status post pancreaticoduodenectomy, melanoma, GERD, hypothyroidism who presents to the emergency department after possible seizure-like activity, altered mental status and strokelike symptoms.  The history is provided by EMS and the patient.  Briefly per EMS they had been called out due to concern for seizure activity.  A coworker endorse the patient had been confused, leaning to the left with loss of consciousness and generalized shaking.  Unclear if this was a syncopal episode versus seizure as EMS felt that symptoms could be more consistent with syncope.  The patient had expressive aphasia at the time and was persistently confused.  She did endorse some left arm tingling this morning and stated that prior to this she had had left arm weakness and heaviness in addition to tingling.  Symptoms have now completely resolved.  The patient arrived to the [facility] emergency department GCS 15, ABC intact with no chest pain, left arm numbness or weakness.  No other neurodeficits.  Reviewed her recent outpatient clinic visit from hematology oncology on 11/06/2021:  CURRENT TREATMENT: Gem/Abraxane - Cycle 1: 06/26/21 - Cycle 2: 07/24/21 - Cycle 3: 08/21/21 - Cycle 4: 09/26/21 - Cycle 5: 10/23/21  ONCOLOGIC HISTORY: Lynn Ashley is a 62 y.o. female with metastatic pancreatic adenocarcinoma. She was initially treated with neoadjuvant FOLFIRINOX. She had a reaction to irinotecan and was subsequently held. She proceeded with resection and was found to have a T1N0 lesion. She did not undergo adjuvant therapy. She was found to have a two lung lesions that were resected, found to be metastatic pancreatic  adenocarcinoma. She underwent 4 cycles of FOLFOX at that time. She subsequently was seen by Dr. Moshe Salisbury at [facility] in [place] and started on Rucaparib on maintenance trial. She has continued on this currently. She was noted in June 2021 to have a small lung lesion that grew. She was treated with SBRT in July 2021. She was on rucaparib maintenance on a trial from 07/2017-06/2021. Started Gem/Abraxane on 06/26/21.   TODAY'S VISIT:  Lynn Ashley presents today for follow up of pancreatic adenocarcinoma. Since her last visit, she has done well with the ever other week treatment with Gem/Abraxane. She is doing well overall with improvement in fatigue. Cough and voice changes have resolved. No cheat pain, shortness of breath. No abdominal pain, diarrhea, constipation. She is planned for some trips this Spring and she would like to adjust treatments accordingly.   Home Medications Prior to Admission medications   Medication Sig Start Date End Date Taking? Authorizing Provider  albuterol (PROAIR HFA) 108 (90 Base) MCG/ACT inhaler Inhale 2 puffs into the lungs every 6 (six) hours as needed. 03/01/16 09/19/17  Juanito Doom, MD  cetirizine (ZYRTEC) 10 MG tablet Take 10 mg by mouth daily.    [provider]  esomeprazole (NEXIUM) 40 MG capsule Take 40 mg by mouth daily.     [provider]  fluticasone (FLONASE) 50 MCG/ACT nasal spray Place 2 sprays into both nostrils daily.    [provider]  insulin aspart (NOVOLOG) 100 UNIT/ML FlexPen Sliding scale 04/26/16   [provider]  levothyroxine (SYNTHROID, LEVOTHROID) 50 MCG tablet Take 50 mcg by mouth daily.  [provider]  Pancrelipase, Lip-Prot-Amyl, 25000 units CPEP Take by mouth. 2 tablets 3 times a day and 1 tablet with a snack 03/13/16   [provider]  QVAR REDIHALER 80 MCG/ACT inhaler INHALE 2 PUFFS INTO THE LUNGS 2 (TWO) TIMES DAILY. 05/23/18   Juanito Doom, MD       Allergies    Amoxicillin-pot clavulanate, Aspirin-caffeine, Irinotecan, Shellfish allergy, Tetanus-diphtheria toxoids td, Amoxicillin-pot clavulanate, Betamethasone, Latex, and Pertussis vaccines    Review of Systems   Review of Systems  Neurological:  Positive for seizures, syncope, weakness and numbness.  Psychiatric/Behavioral:  Positive for confusion.   All other systems reviewed and are negative.  Physical Exam Updated Vital Signs BP (!) 143/75    Pulse 75    Temp 98.8 F (37.1 C)    Resp 14    SpO2 99%  Physical Exam Vitals and nursing note reviewed.  Constitutional:      General: She is not in acute distress.    Appearance: She is well-developed.  HENT:     Head: Normocephalic and atraumatic.  Eyes:     Conjunctiva/sclera: Conjunctivae normal.  Cardiovascular:     Rate and Rhythm: Normal rate and regular rhythm.     Heart sounds: No murmur heard. Pulmonary:     Effort: Pulmonary effort is normal. No respiratory distress.     Breath sounds: Normal breath sounds.  Abdominal:     Palpations: Abdomen is soft.     Tenderness: There is no abdominal tenderness.  Musculoskeletal:        General: No swelling.     Cervical back: Neck supple.  Skin:    General: Skin is warm and dry.     Capillary Refill: Capillary refill takes less than 2 seconds.  Neurological:     Mental Status: She is alert and oriented to person, place, and time.     GCS: GCS eye subscore is 4. GCS verbal subscore is 5. GCS motor subscore is 6.     Cranial Nerves: Cranial nerves 2-12 are intact.     Sensory: Sensation is intact.     Motor: Motor function is intact.  Psychiatric:        Mood and Affect: Mood normal.    ED Results / Procedures / Treatments   Labs (all labs ordered are listed, but only abnormal results are displayed) Labs Reviewed  CBC - Abnormal; Notable for the following components:      Result Value   RDW 19.6 (*)    All other components within normal limits  COMPREHENSIVE  METABOLIC PANEL - Abnormal; Notable for the following components:   Sodium 131 (*)    Potassium 5.4 (*)    Chloride 96 (*)    Glucose, Bld 209 (*)    BUN 29 (*)    Creatinine, Ser 1.18 (*)    AST 61 (*)    GFR, Estimated 53 (*)    All other components within normal limits  HEMOGLOBIN A1C - Abnormal; Notable for the following components:   Hgb A1c MFr Bld 6.8 (*)    All other components within normal limits  URINALYSIS, ROUTINE W REFLEX MICROSCOPIC - Abnormal; Notable for the following components:   APPearance HAZY (*)    Hgb urine dipstick MODERATE (*)    Bacteria, UA RARE (*)    All other components within normal limits  CBG MONITORING, ED - Abnormal; Notable for the following components:   Glucose-Capillary 155 (*)    All other  components within normal limits  RESP PANEL BY RT-PCR (FLU A&B, COVID) ARPGX2  RAPID URINE DRUG SCREEN, HOSP PERFORMED  ETHANOL  TROPONIN I (HIGH SENSITIVITY)  TROPONIN I (HIGH SENSITIVITY)    EKG EKG Interpretation  Date/Time:  Wednesday November 08 2021 16:10:44 EST Ventricular Rate:  63 PR Interval:  119 QRS Duration: 93 QT Interval:  443 QTC Calculation: 454 R Axis:   -3 Text Interpretation: Sinus rhythm Borderline short PR interval Abnormal R-wave progression, early transition Abnormal T, consider ischemia, lateral leads Confirmed by Regan Lemming ([id number]) on 11/08/2021 5:39:54 PM  Radiology DG Chest 2 View  Result Date: 11/08/2021 CLINICAL DATA:  TIA. EXAM: CHEST - 2 VIEW COMPARISON:  Chest x-ray dated July 13, 2013. FINDINGS: Left chest wall port catheter with tip in the proximal right atrium. The heart size and mediastinal contours are within normal limits. Normal pulmonary vascularity. Postsurgical changes overlying the left upper lobe. Streaky opacity at the left lung base. No focal consolidation, pleural effusion, or pneumothorax. No acute osseous abnormality. IMPRESSION: 1. Left basilar atelectasis. Electronically Signed   By: Titus Dubin M.D.   On: 11/08/2021 17:54   CT HEAD WO CONTRAST  Result Date: 11/08/2021 CLINICAL DATA:  Transient ischemic attack.  Confusion. EXAM: CT HEAD WITHOUT CONTRAST TECHNIQUE: Contiguous axial images were obtained from the base of the skull through the vertex without intravenous contrast. RADIATION DOSE REDUCTION: This exam was performed according to the departmental dose-optimization program which includes automated exposure control, adjustment of the mA and/or kV according to patient size and/or use of iterative reconstruction technique. COMPARISON:  None. FINDINGS: Brain: Several foci of hyperintensity demonstrated along the gyral cortical surface in both parietal regions in the left frontal region at the vertex. Possibly a fourth lesion in the right posterior parietal region. Largest lesion measures about 7 mm diameter. Minimal adjacent edema. Differential diagnosis would include metastatic disease or less likely embolic process or infection. MRI with contrast material is recommended for further evaluation. There is no mass effect or midline shift. No abnormal extra-axial fluid collections. Gray-white matter junctions are distinct. Basal cisterns are not effaced. Ventricles are not dilated. Vascular: Moderate intracranial arterial vascular calcifications. Skull: Calvarium appears intact. Sinuses/Orbits: Paranasal sinuses and mastoid air cells are clear. Other: None. IMPRESSION: Multiple hyperintense foci demonstrated along the gyral cortical surface bilaterally. Appearance is most likely to represent metastatic disease although embolic process or infection would be less likely considerations. MRI with contrast material is recommended for further evaluation. Electronically Signed   By: Lucienne Capers M.D.   On: 11/08/2021 17:48   MR ANGIO HEAD WO CONTRAST  Result Date: 11/08/2021 CLINICAL DATA:  Initial evaluation for acute stroke. EXAM: MRI HEAD WITHOUT CONTRAST MRA HEAD WITHOUT CONTRAST MRA NECK  WITHOUT CONTRAST TECHNIQUE: Multiplanar, multiecho pulse sequences of the brain and surrounding structures were obtained without intravenous contrast. Angiographic images of the Circle of Willis were obtained using MRA technique without intravenous contrast. Angiographic images of the neck were obtained using MRA technique without intravenous contrast. Carotid stenosis measurements (when applicable) are obtained utilizing NASCET criteria, using the distal internal carotid diameter as the denominator. COMPARISON:  Prior CT from earlier the same day. FINDINGS: MRI HEAD FINDINGS Brain: Cerebral volume within normal limits. No significant cerebral white matter disease for age. Multifocal areas of abnormal T2/FLAIR signal intensity seen involving bilateral cerebral hemispheres. These are positioned along the gray-white matter differentiation, with appearance most characteristic of small areas of vasogenic edema. There are at least 6 separate areas  in total. Overall appearance is most concerning for metastatic disease. Associated susceptibility artifact and intrinsic T1 hyperintensity about a few lesions consistent with associated hemorrhage/hemorrhagic metastases. No significant regional mass effect or midline shift. No evidence for acute infarct. Gray-white matter differentiation otherwise maintained. No areas of chronic cortical infarction. No midline shift or hydrocephalus. No extra-axial fluid collection. Pituitary gland suprasellar region normal. Midline structures intact. Vascular: Major intracranial vascular flow voids are maintained. Skull and upper cervical spine: Craniocervical junction within normal limits. Bone marrow signal intensity normal. No scalp soft tissue abnormality. Sinuses/Orbits: Postsurgical changes noted at the right globe. Globes and orbital soft tissues otherwise unremarkable. Moderate right sphenoid sinusitis. Paranasal sinuses are otherwise clear. No mastoid effusion. Inner ear structures  normal. Other: None. MRA HEAD FINDINGS ANTERIOR CIRCULATION: Both internal carotid arteries widely patent to the termini without stenosis. A1 segments widely patent. Normal anterior communicating artery complex. Both anterior cerebral arteries widely patent to their distal aspects without stenosis. No M1 stenosis or occlusion. Normal MCA bifurcations. Distal MCA branches well perfused and symmetric. POSTERIOR CIRCULATION: Both V4 segments patent to the vertebrobasilar junction without stenosis. Both PICA origins patent and normal. Basilar widely patent to its distal aspect without stenosis. Superior cerebellar arteries patent bilaterally. Both PCAs primarily supplied via the basilar and are well perfused to there distal aspects. No intracranial aneurysm. MRA NECK FINDINGS AORTIC ARCH: Aortic arch and origin of the great vessels not included on this exam. RIGHT CAROTID SYSTEM: Visualized right CCA widely patent to the bifurcation. No significant atheromatous irregularity or stenosis about the right carotid bulb. Right ICA patent distally without stenosis or dissection. LEFT CAROTID SYSTEM: Visualized left CCA patent without stenosis. No significant atheromatous narrowing about the left carotid bulb. Left ICA patent distally without stenosis or dissection. VERTEBRAL ARTERIES: Both vertebral arteries arise from subclavian arteries. Vertebral arteries widely patent without stenosis or dissection. IMPRESSION: MRI HEAD IMPRESSION: 1. Multiple small scattered areas of abnormal T2/FLAIR signal intensity involving the bilateral cerebral hemispheres, positioned along the gray-white matter differentiation, and most concerning for metastatic disease. No significant regional mass effect. Associated susceptibility artifact consistent with hemorrhage. 2. No other acute intracranial abnormality. 3. Moderate right sphenoid sinusitis. MRA HEAD IMPRESSION: Normal intracranial MRA. No large vessel occlusion, hemodynamically significant  stenosis, or other acute vascular abnormality. MRA NECK IMPRESSION: Normal MRA of the neck. Electronically Signed   By: Jeannine Boga M.D.   On: 11/08/2021 20:29   MR MRA NECK WO CONTRAST  Result Date: 11/08/2021 CLINICAL DATA:  Initial evaluation for acute stroke. EXAM: MRI HEAD WITHOUT CONTRAST MRA HEAD WITHOUT CONTRAST MRA NECK WITHOUT CONTRAST TECHNIQUE: Multiplanar, multiecho pulse sequences of the brain and surrounding structures were obtained without intravenous contrast. Angiographic images of the Circle of Willis were obtained using MRA technique without intravenous contrast. Angiographic images of the neck were obtained using MRA technique without intravenous contrast. Carotid stenosis measurements (when applicable) are obtained utilizing NASCET criteria, using the distal internal carotid diameter as the denominator. COMPARISON:  Prior CT from earlier the same day. FINDINGS: MRI HEAD FINDINGS Brain: Cerebral volume within normal limits. No significant cerebral white matter disease for age. Multifocal areas of abnormal T2/FLAIR signal intensity seen involving bilateral cerebral hemispheres. These are positioned along the gray-white matter differentiation, with appearance most characteristic of small areas of vasogenic edema. There are at least 6 separate areas in total. Overall appearance is most concerning for metastatic disease. Associated susceptibility artifact and intrinsic T1 hyperintensity about a few lesions consistent with associated hemorrhage/hemorrhagic  metastases. No significant regional mass effect or midline shift. No evidence for acute infarct. Gray-white matter differentiation otherwise maintained. No areas of chronic cortical infarction. No midline shift or hydrocephalus. No extra-axial fluid collection. Pituitary gland suprasellar region normal. Midline structures intact. Vascular: Major intracranial vascular flow voids are maintained. Skull and upper cervical spine:  Craniocervical junction within normal limits. Bone marrow signal intensity normal. No scalp soft tissue abnormality. Sinuses/Orbits: Postsurgical changes noted at the right globe. Globes and orbital soft tissues otherwise unremarkable. Moderate right sphenoid sinusitis. Paranasal sinuses are otherwise clear. No mastoid effusion. Inner ear structures normal. Other: None. MRA HEAD FINDINGS ANTERIOR CIRCULATION: Both internal carotid arteries widely patent to the termini without stenosis. A1 segments widely patent. Normal anterior communicating artery complex. Both anterior cerebral arteries widely patent to their distal aspects without stenosis. No M1 stenosis or occlusion. Normal MCA bifurcations. Distal MCA branches well perfused and symmetric. POSTERIOR CIRCULATION: Both V4 segments patent to the vertebrobasilar junction without stenosis. Both PICA origins patent and normal. Basilar widely patent to its distal aspect without stenosis. Superior cerebellar arteries patent bilaterally. Both PCAs primarily supplied via the basilar and are well perfused to there distal aspects. No intracranial aneurysm. MRA NECK FINDINGS AORTIC ARCH: Aortic arch and origin of the great vessels not included on this exam. RIGHT CAROTID SYSTEM: Visualized right CCA widely patent to the bifurcation. No significant atheromatous irregularity or stenosis about the right carotid bulb. Right ICA patent distally without stenosis or dissection. LEFT CAROTID SYSTEM: Visualized left CCA patent without stenosis. No significant atheromatous narrowing about the left carotid bulb. Left ICA patent distally without stenosis or dissection. VERTEBRAL ARTERIES: Both vertebral arteries arise from subclavian arteries. Vertebral arteries widely patent without stenosis or dissection. IMPRESSION: MRI HEAD IMPRESSION: 1. Multiple small scattered areas of abnormal T2/FLAIR signal intensity involving the bilateral cerebral hemispheres, positioned along the  gray-white matter differentiation, and most concerning for metastatic disease. No significant regional mass effect. Associated susceptibility artifact consistent with hemorrhage. 2. No other acute intracranial abnormality. 3. Moderate right sphenoid sinusitis. MRA HEAD IMPRESSION: Normal intracranial MRA. No large vessel occlusion, hemodynamically significant stenosis, or other acute vascular abnormality. MRA NECK IMPRESSION: Normal MRA of the neck. Electronically Signed   By: Jeannine Boga M.D.   On: 11/08/2021 20:29   MR BRAIN WO CONTRAST  Result Date: 11/08/2021 CLINICAL DATA:  Initial evaluation for acute stroke. EXAM: MRI HEAD WITHOUT CONTRAST MRA HEAD WITHOUT CONTRAST MRA NECK WITHOUT CONTRAST TECHNIQUE: Multiplanar, multiecho pulse sequences of the brain and surrounding structures were obtained without intravenous contrast. Angiographic images of the Circle of Willis were obtained using MRA technique without intravenous contrast. Angiographic images of the neck were obtained using MRA technique without intravenous contrast. Carotid stenosis measurements (when applicable) are obtained utilizing NASCET criteria, using the distal internal carotid diameter as the denominator. COMPARISON:  Prior CT from earlier the same day. FINDINGS: MRI HEAD FINDINGS Brain: Cerebral volume within normal limits. No significant cerebral white matter disease for age. Multifocal areas of abnormal T2/FLAIR signal intensity seen involving bilateral cerebral hemispheres. These are positioned along the gray-white matter differentiation, with appearance most characteristic of small areas of vasogenic edema. There are at least 6 separate areas in total. Overall appearance is most concerning for metastatic disease. Associated susceptibility artifact and intrinsic T1 hyperintensity about a few lesions consistent with associated hemorrhage/hemorrhagic metastases. No significant regional mass effect or midline shift. No evidence for  acute infarct. Gray-white matter differentiation otherwise maintained. No areas of chronic cortical infarction.  No midline shift or hydrocephalus. No extra-axial fluid collection. Pituitary gland suprasellar region normal. Midline structures intact. Vascular: Major intracranial vascular flow voids are maintained. Skull and upper cervical spine: Craniocervical junction within normal limits. Bone marrow signal intensity normal. No scalp soft tissue abnormality. Sinuses/Orbits: Postsurgical changes noted at the right globe. Globes and orbital soft tissues otherwise unremarkable. Moderate right sphenoid sinusitis. Paranasal sinuses are otherwise clear. No mastoid effusion. Inner ear structures normal. Other: None. MRA HEAD FINDINGS ANTERIOR CIRCULATION: Both internal carotid arteries widely patent to the termini without stenosis. A1 segments widely patent. Normal anterior communicating artery complex. Both anterior cerebral arteries widely patent to their distal aspects without stenosis. No M1 stenosis or occlusion. Normal MCA bifurcations. Distal MCA branches well perfused and symmetric. POSTERIOR CIRCULATION: Both V4 segments patent to the vertebrobasilar junction without stenosis. Both PICA origins patent and normal. Basilar widely patent to its distal aspect without stenosis. Superior cerebellar arteries patent bilaterally. Both PCAs primarily supplied via the basilar and are well perfused to there distal aspects. No intracranial aneurysm. MRA NECK FINDINGS AORTIC ARCH: Aortic arch and origin of the great vessels not included on this exam. RIGHT CAROTID SYSTEM: Visualized right CCA widely patent to the bifurcation. No significant atheromatous irregularity or stenosis about the right carotid bulb. Right ICA patent distally without stenosis or dissection. LEFT CAROTID SYSTEM: Visualized left CCA patent without stenosis. No significant atheromatous narrowing about the left carotid bulb. Left ICA patent distally without  stenosis or dissection. VERTEBRAL ARTERIES: Both vertebral arteries arise from subclavian arteries. Vertebral arteries widely patent without stenosis or dissection. IMPRESSION: MRI HEAD IMPRESSION: 1. Multiple small scattered areas of abnormal T2/FLAIR signal intensity involving the bilateral cerebral hemispheres, positioned along the gray-white matter differentiation, and most concerning for metastatic disease. No significant regional mass effect. Associated susceptibility artifact consistent with hemorrhage. 2. No other acute intracranial abnormality. 3. Moderate right sphenoid sinusitis. MRA HEAD IMPRESSION: Normal intracranial MRA. No large vessel occlusion, hemodynamically significant stenosis, or other acute vascular abnormality. MRA NECK IMPRESSION: Normal MRA of the neck. Electronically Signed   By: Jeannine Boga M.D.   On: 11/08/2021 20:29    Procedures Procedures    Medications Ordered in ED Medications  LORazepam (ATIVAN) tablet 1 mg (1 mg Oral Not Given 11/08/21 1915)  aspirin tablet 325 mg (325 mg Oral Given 11/08/21 1705)  LORazepam (ATIVAN) injection 1 mg (1 mg Intravenous Given 11/08/21 1822)  dexamethasone (DECADRON) injection 10 mg (10 mg Intravenous Given 11/08/21 2045)  levETIRAcetam (KEPPRA) IVPB 1500 mg/ 100 mL premix (1,500 mg Intravenous New Bag/Given 11/08/21 2052)  sodium zirconium cyclosilicate (LOKELMA) packet 10 g (10 g Oral Given 11/08/21 2049)    ED Course/ Medical Decision Making/ A&P                           Medical Decision Making Amount and/or Complexity of Data Reviewed Labs: ordered. Radiology: ordered.  Risk OTC drugs. Prescription drug management.   61 year old female with a history of pancreatic cancer, on chemotherapy, status post pancreaticoduodenectomy, melanoma, GERD, hypothyroidism who presents to the emergency department after possible seizure-like activity, altered mental status and strokelike symptoms.  The history is provided by EMS and the  patient.  Briefly per EMS they had been called out due to concern for seizure activity.  A coworker endorse the patient had been confused, leaning to the left with loss of consciousness and generalized shaking.  Unclear if this was a syncopal episode versus  seizure as EMS felt that symptoms could be more consistent with syncope.  The patient had expressive aphasia at the time and was persistently confused.  She did endorse some left arm tingling this morning and stated that prior to this she had had left arm weakness and heaviness in addition to tingling.  Symptoms have now completely resolved.  The patient arrived to the [facility] emergency department GCS 15, ABC intact with no chest pain, left arm numbness or weakness.  No other neurodeficits.  On arrival, the patient was afebrile, borderline elevated temperature to 99, hemodynamically stable, pulse 65, BP 163/71, saturating 100% on room air.  The patient had an EKG which revealed sinus rhythm, ventricular rate 63, abnormal T waves in the lateral leads present.  No ST segment changes.  Sinus rhythm noted on cardiac telemetry.  The patient has returned to her baseline mental status.  Differential diagnosis includes TIA, seizure with Todd's paralysis, new intracranial mass, less likely CVA.  The patient had normal neurologic exam.  CT of the head was performed which revealed multiple hyperintense foci demonstrated on the gyral cortical surface bilaterally most likely representing metastatic disease although embolic process or infection could be less likely considerations.  MRI was recommended for further evaluation.  Suspect strongly that this is metastatic disease from the patient's known metastatic pancreatic cancer.  The patient was administered IV Decadron and IV Keppra.  I called the physician access line for [facility] that she receives her care through hematology oncology there.  I spoke with Dr. Kendall Flack over the [facility] line who accepted the patient in  transfer to [facility] for further management.  He agreed with the plan for Decadron and Keppra and transferred for admission and further Rad Onc/neurosurgery consultation and management.  Laboratory work-up significant for mild hyperkalemia to 5.4.  She had no EKG changes and was administered oral Lokelma.  Mild hyponatremia to 131, hyperglycemia to 209 without an anion gap acidosis with a Bicarb of 22.  MRI imaging of the brain did reveal multiple small scattered areas of abnormal T2/flair signal involving the bilateral cerebral hemispheres concerning for metastatic disease with no significant regional mass effect, associated artifact consistent with small hemorrhage, at least 6 separate areas in total.  I informed the patient and her significant other bedside of the diagnosis and imaging findings.  They are comfortable with the plan for transfer to [facility] for further care and management.  EM[facility]ALA completed and [facility] contacted for transport.   Final Clinical Impression(s) / ED Diagnoses Final diagnoses:  Hyperkalemia  Metastatic adenocarcinoma to brain [facility])    Rx / DC Orders ED Discharge Orders     None         Regan Lemming, MD 11/08/21 2109    Regan Lemming, MD 11/08/21 2110

## 2021-11-08 NOTE — ED Notes (Signed)
[facility] called back for transport, The patient has a room ready and it is cleaned. [place], The admitting doctor is Dr. Kendall Flack. To call for report: (Desk) [phone] (floor) [phone], they advise you to call the desk first.

## 2021-11-08 NOTE — ED Notes (Signed)
Pt will be transferred to room 624 at [facility].

## 2021-11-08 NOTE — ED Notes (Signed)
1mg  Ativan wasted with Oretha Caprice RN.

## 2021-11-08 NOTE — ED Notes (Addendum)
Patient transported to ct

## 2021-11-08 NOTE — ED Notes (Signed)
1 mg Ativan wasted with Vinson Moselle

## 2022-06-01 ENCOUNTER — Other Ambulatory Visit (HOSPITAL_COMMUNITY): Payer: Self-pay

## 2022-06-01 MED ORDER — INSULIN GLARGINE-YFGN 100 UNIT/ML ~~LOC~~ SOPN
PEN_INJECTOR | SUBCUTANEOUS | 2 refills | Status: AC
  Filled 2022-06-01: qty 3, 33d supply, fill #0

## 2022-06-05 ENCOUNTER — Other Ambulatory Visit (HOSPITAL_COMMUNITY): Payer: Self-pay

## 2022-06-05 MED ORDER — TECHLITE PEN NEEDLES 32G X 4 MM MISC
0 refills | Status: AC
  Filled 2022-06-05: qty 100, 90d supply, fill #0
